# Patient Record
Sex: Female | Born: 2000 | Race: Black or African American | Hispanic: No | Marital: Single | Smoking: Never smoker
Health system: Southern US, Community
[De-identification: ages and names within clinical notes are randomized; demographics above are authoritative.]

## PROBLEM LIST (undated history)

## (undated) ENCOUNTER — Ambulatory Visit: Admission: EM | Payer: BC Managed Care – PPO | Source: Home / Self Care

## (undated) ENCOUNTER — Ambulatory Visit: Source: Home / Self Care

## (undated) DIAGNOSIS — F988 Other specified behavioral and emotional disorders with onset usually occurring in childhood and adolescence: Secondary | ICD-10-CM

## (undated) DIAGNOSIS — J45909 Unspecified asthma, uncomplicated: Secondary | ICD-10-CM

## (undated) HISTORY — DX: Unspecified asthma, uncomplicated: J45.909

---
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
## 2022-01-28 ENCOUNTER — Ambulatory Visit
Admission: EM | Admit: 2022-01-28 | Discharge: 2022-01-28 | Disposition: A | Discharge status: Home / Self Care | Payer: Medicaid Other | Attending: Physician Assistant | Admitting: Physician Assistant

## 2022-01-28 ENCOUNTER — Other Ambulatory Visit: Payer: Self-pay

## 2022-01-28 ENCOUNTER — Encounter: Payer: Self-pay | Admitting: Emergency Medicine

## 2022-01-28 DIAGNOSIS — J069 Acute upper respiratory infection, unspecified: Secondary | ICD-10-CM

## 2022-01-28 NOTE — ED Provider Notes (Signed)
[name]    CSN: [id number] Arrival date & time: 01/28/22  1047      History   Chief Complaint Chief Complaint  Patient presents with   Sore Throat    HPI Melanie Stephenson is a 21 y.o. female.   Patient here today for evaluation of cough, sore throat, headache, body aches and chills that started 4 days ago. She has not taken any otc meds. She denies any nausea, vomiting or diarrhea.   The history is provided by the patient.   Past Medical History:  Diagnosis Date   ADD (attention deficit disorder)     Patient Active Problem List   Diagnosis Date Noted   ADD (attention deficit disorder)     History reviewed. No pertinent surgical history.  OB History   No obstetric history on file.      Home Medications    Prior to Admission medications   Medication Sig Start Date End Date Taking? Authorizing Provider  HYDROcodone bit-homatropine (HYCODAN) 5-1.5 MG/5ML syrup Take 5 mLs by mouth every 6 (six) hours as needed for cough. 11/13/21   Mesner, Corene Cornea, MD  lisdexamfetamine (VYVANSE) 20 MG capsule Take 20 mg by mouth daily.    [provider]  ondansetron (ZOFRAN-ODT) 4 MG disintegrating tablet 4mg  ODT q4 hours prn nausea/vomit 11/13/21   Mesner, Corene Cornea, MD  triamcinolone cream (KENALOG) 0.1 % Apply 1 application topically 2 (two) times daily. May use on face 05/26/14   Janne Napoleon, NP    Family History Family History  Problem Relation Age of Onset   Healthy Other     Social History Social History   Tobacco Use   Smoking status: Never  Substance Use Topics   Alcohol use: Not Currently   Drug use: Not Currently     Allergies   Other   Review of Systems Review of Systems  Constitutional:  Positive for chills. Negative for fever.  HENT:  Positive for congestion and sore throat. Negative for ear pain.   Eyes:  Negative for discharge and redness.  Respiratory:  Positive for cough. Negative for shortness of breath and wheezing.    Gastrointestinal:  Negative for abdominal pain, diarrhea, nausea and vomiting.  Musculoskeletal:  Positive for myalgias.  Neurological:  Positive for headaches.    Physical Exam Triage Vital Signs ED Triage Vitals  Enc Vitals Group     BP      Pulse      Resp      Temp      Temp src      SpO2      Weight      Height      Head Circumference      Peak Flow      Pain Score      Pain Loc      Pain Edu?      Excl. in [place]?    No data found.  Updated Vital Signs BP 121/86 (BP Location: Left Arm)    Pulse 82    Temp 98.4 F (36.9 C) (Oral)    Resp 18    Ht 5\' 2"  (1.575 m)    Wt 136 lb (61.7 kg)    LMP 01/08/2022    SpO2 97%    BMI 24.87 kg/m      Physical Exam Vitals and nursing note reviewed.  Constitutional:      General: She is not in acute distress.    Appearance: Normal appearance. She is not ill-appearing.  HENT:     Head: Normocephalic and atraumatic.     Nose: Congestion present.     Mouth/Throat:     Mouth: Mucous membranes are moist.     Pharynx: No oropharyngeal exudate or posterior oropharyngeal erythema.  Eyes:     Conjunctiva/sclera: Conjunctivae normal.  Cardiovascular:     Rate and Rhythm: Normal rate and regular rhythm.     Heart sounds: Normal heart sounds. No murmur heard. Pulmonary:     Effort: Pulmonary effort is normal. No respiratory distress.     Breath sounds: Normal breath sounds. No wheezing, rhonchi or rales.  Skin:    General: Skin is warm and dry.  Neurological:     Mental Status: She is alert.  Psychiatric:        Mood and Affect: Mood normal.        Thought Content: Thought content normal.     UC Treatments / Results  Labs (all labs ordered are listed, but only abnormal results are displayed) Labs Reviewed  COVID-19, FLU A+B NAA    EKG   Radiology No results found.  Procedures Procedures (including critical care time)  Medications Ordered in UC Medications - No data to display  Initial Impression / Assessment  and Plan / UC Course  I have reviewed the triage vital signs and the nursing notes.  Pertinent labs & imaging results that were available during my care of the patient were reviewed by me and considered in my medical decision making (see chart for details).    Suspect viral etiology of symptoms. Recommend symptomatic treatment and will screen for covid and flu. Encourage follow up with any further concerns.   Final Clinical Impressions(s) / UC Diagnoses   Final diagnoses:  Acute upper respiratory infection   Discharge Instructions   None    ED Prescriptions   None    PDMP not reviewed this encounter.   Francene Finders, PA-C 01/28/22 1238

## 2022-01-28 NOTE — ED Triage Notes (Signed)
Patient c/o productive cough, sore throat, headache, body aches and chills x 4 days.  Patient denies any OTC meds.

## 2022-01-29 LAB — COVID-19, FLU A+B NAA
Influenza A, NAA: NOT DETECTED
Influenza B, NAA: NOT DETECTED
SARS-CoV-2, NAA: NOT DETECTED

## 2022-05-21 ENCOUNTER — Ambulatory Visit (HOSPITAL_COMMUNITY)
Admission: EM | Admit: 2022-05-21 | Discharge: 2022-05-21 | Disposition: A | Discharge status: Home / Self Care | Payer: Medicaid Other | Attending: Student | Admitting: Student

## 2022-05-21 ENCOUNTER — Emergency Department (HOSPITAL_COMMUNITY): Payer: Medicaid Other

## 2022-05-21 ENCOUNTER — Emergency Department (HOSPITAL_COMMUNITY)
Admission: EM | Admit: 2022-05-21 | Discharge: 2022-05-21 | Disposition: A | Discharge status: Home / Self Care | Payer: Medicaid Other | Attending: Emergency Medicine | Admitting: Emergency Medicine

## 2022-05-21 ENCOUNTER — Encounter (HOSPITAL_COMMUNITY): Payer: Self-pay

## 2022-05-21 ENCOUNTER — Other Ambulatory Visit: Payer: Self-pay

## 2022-05-21 ENCOUNTER — Encounter (HOSPITAL_COMMUNITY): Payer: Self-pay | Admitting: Emergency Medicine

## 2022-05-21 DIAGNOSIS — R110 Nausea: Secondary | ICD-10-CM | POA: Insufficient documentation

## 2022-05-21 DIAGNOSIS — R1011 Right upper quadrant pain: Secondary | ICD-10-CM

## 2022-05-21 DIAGNOSIS — R1013 Epigastric pain: Secondary | ICD-10-CM | POA: Insufficient documentation

## 2022-05-21 LAB — CBC WITH DIFFERENTIAL/PLATELET
Abs Immature Granulocytes: 0.03 10*3/uL (ref 0.00–0.07)
Basophils Absolute: 0 10*3/uL (ref 0.0–0.1)
Basophils Relative: 1 %
Eosinophils Absolute: 0.1 10*3/uL (ref 0.0–0.5)
Eosinophils Relative: 2 %
HCT: 35.2 % — ABNORMAL LOW (ref 36.0–46.0)
Hemoglobin: 11.1 g/dL — ABNORMAL LOW (ref 12.0–15.0)
Immature Granulocytes: 1 %
Lymphocytes Relative: 39 %
Lymphs Abs: 2.4 10*3/uL (ref 0.7–4.0)
MCH: 29.2 pg (ref 26.0–34.0)
MCHC: 31.5 g/dL (ref 30.0–36.0)
MCV: 92.6 fL (ref 80.0–100.0)
Monocytes Absolute: 0.6 10*3/uL (ref 0.1–1.0)
Monocytes Relative: 10 %
Neutro Abs: 3 10*3/uL (ref 1.7–7.7)
Neutrophils Relative %: 47 %
Platelets: 302 10*3/uL (ref 150–400)
RBC: 3.8 MIL/uL — ABNORMAL LOW (ref 3.87–5.11)
RDW: 13.6 % (ref 11.5–15.5)
WBC: 6.2 10*3/uL (ref 4.0–10.5)
nRBC: 0 % (ref 0.0–0.2)

## 2022-05-21 LAB — URINALYSIS, ROUTINE W REFLEX MICROSCOPIC
Bilirubin Urine: NEGATIVE
Glucose, UA: NEGATIVE mg/dL
Hgb urine dipstick: NEGATIVE
Ketones, ur: NEGATIVE mg/dL
Nitrite: NEGATIVE
Protein, ur: NEGATIVE mg/dL
Specific Gravity, Urine: 1.021 (ref 1.005–1.030)
pH: 6 (ref 5.0–8.0)

## 2022-05-21 LAB — COMPREHENSIVE METABOLIC PANEL
ALT: 39 U/L (ref 0–44)
AST: 34 U/L (ref 15–41)
Albumin: 4.1 g/dL (ref 3.5–5.0)
Alkaline Phosphatase: 42 U/L (ref 38–126)
Anion gap: 6 (ref 5–15)
BUN: 12 mg/dL (ref 6–20)
CO2: 28 mmol/L (ref 22–32)
Calcium: 9.6 mg/dL (ref 8.9–10.3)
Chloride: 106 mmol/L (ref 98–111)
Creatinine, Ser: 0.69 mg/dL (ref 0.44–1.00)
GFR, Estimated: >60 mL/min (ref >60)
Glucose, Bld: 98 mg/dL (ref 70–99)
Potassium: 3.9 mmol/L (ref 3.5–5.1)
Sodium: 140 mmol/L (ref 135–145)
Total Bilirubin: 0.5 mg/dL (ref 0.3–1.2)
Total Protein: 7.2 g/dL (ref 6.5–8.1)

## 2022-05-21 LAB — POC URINE PREG, ED: Preg Test, Ur: NEGATIVE

## 2022-05-21 LAB — LIPASE, BLOOD: Lipase: 28 U/L (ref 11–51)

## 2022-05-21 LAB — I-STAT BETA HCG BLOOD, ED (MC, WL, AP ONLY): I-stat hCG, quantitative: <5 m[IU]/mL (ref <5)

## 2022-05-21 MED ORDER — SUCRALFATE 1 G PO TABS: 1.0000 g | ORAL_TABLET | Freq: Three times a day (TID) | ORAL | 1 refills | Status: DC

## 2022-05-21 MED ORDER — OMEPRAZOLE 20 MG PO CPDR
20.0000 mg | DELAYED_RELEASE_CAPSULE | Freq: Every day | ORAL | 1 refills | Status: DC
Start: 2022-05-21 — End: 2022-07-07

## 2022-05-21 NOTE — Discharge Instructions (Addendum)
Get help right away if: You have sudden, sharp, or persistent pain in your abdomen. You have bloody or dark black, tarry stools. You vomit blood or material that looks like coffee grounds. You become light-headed or you feel faint. You become weak. You become sweaty or clammy. 

## 2022-05-21 NOTE — ED Provider Notes (Signed)
[place]    CSN: [id number] Arrival date & time: 05/21/22  1717      History   Chief Complaint Chief Complaint  Patient presents with   Abdominal Pain    HPI Melanie Stephenson is a 21 y.o. female presenting with right upper quadrant abdominal pain for 3 days.  History noncontributory, no prior history of abdominal procedures or surgeries.  Pain feels as if a hole is being stabbed in her abdomen.  The pain is worse after eating and drinking.  There is some nausea but no vomiting.  She does also endorse intermittent reflux, but this is not the predominant symptom.  She denies dysuria, hematuria, frequency, urgency.  Bowel movements are regular and she is still passing gas.  Has not attempted medications for the symptoms.  Last menstrual period was 05/08/2022, states she is not pregnant or breast-feeding.  HPI  Past Medical History:  Diagnosis Date   ADD (attention deficit disorder)     Patient Active Problem List   Diagnosis Date Noted   ADD (attention deficit disorder)     History reviewed. No pertinent surgical history.  OB History   No obstetric history on file.      Home Medications    Prior to Admission medications   Not on File    Family History Family History  Problem Relation Age of Onset   Healthy Other     Social History Social History   Tobacco Use   Smoking status: Never  Substance Use Topics   Alcohol use: Not Currently   Drug use: Not Currently     Allergies   Other   Review of Systems Review of Systems  Gastrointestinal:  Positive for abdominal pain and nausea.  All other systems reviewed and are negative.   Physical Exam Triage Vital Signs ED Triage Vitals  Enc Vitals Group     BP 05/21/22 1738 114/76     Pulse Rate 05/21/22 1738 77     Resp 05/21/22 1738 16     Temp 05/21/22 1738 98.3 F (36.8 C)     Temp Source 05/21/22 1738 Oral     SpO2 05/21/22 1738 98 %     Weight --      Height --      Head Circumference --       Peak Flow --      Pain Score 05/21/22 1742 7     Pain Loc --      Pain Edu? --      Excl. in [place]? --    No data found.  Updated Vital Signs BP 114/76 (BP Location: Right Arm)   Pulse 77   Temp 98.3 F (36.8 C) (Oral)   Resp 16   LMP 05/08/2022 (Exact Date)   SpO2 98%   Visual Acuity Right Eye Distance:   Left Eye Distance:   Bilateral Distance:    Right Eye Near:   Left Eye Near:    Bilateral Near:     Physical Exam Vitals reviewed.  Constitutional:      General: She is not in acute distress.    Appearance: Normal appearance. She is not ill-appearing.  HENT:     Head: Normocephalic and atraumatic.     Mouth/Throat:     Mouth: Mucous membranes are moist.     Comments: Moist mucous membranes Eyes:     Extraocular Movements: Extraocular movements intact.     Pupils: Pupils are equal, round, and reactive to light.  Cardiovascular:     Rate and Rhythm: Normal rate and regular rhythm.     Heart sounds: Normal heart sounds.  Pulmonary:     Effort: Pulmonary effort is normal.     Breath sounds: Normal breath sounds. No wheezing, rhonchi or rales.  Abdominal:     General: Bowel sounds are normal. There is no distension.     Palpations: Abdomen is soft. There is no mass.     Tenderness: There is abdominal tenderness in the right upper quadrant. There is no right CVA tenderness, left CVA tenderness, guarding or rebound. Positive signs include Murphy's sign.     Comments: Significant RUQ pain to palpation. Pt uncomfortable throughout exam. No rebound.  Skin:    General: Skin is warm.     Capillary Refill: Capillary refill takes less than 2 seconds.     Comments: Good skin turgor  Neurological:     General: No focal deficit present.     Mental Status: She is alert and oriented to person, place, and time.  Psychiatric:        Mood and Affect: Mood normal.        Behavior: Behavior normal.     UC Treatments / Results  Labs (all labs ordered are listed, but only  abnormal results are displayed) Labs Reviewed  POC URINE PREG, ED    EKG   Radiology No results found.  Procedures Procedures (including critical care time)  Medications Ordered in UC Medications - No data to display  Initial Impression / Assessment and Plan / UC Course  I have reviewed the triage vital signs and the nursing notes.  Pertinent labs & imaging results that were available during my care of the patient were reviewed by me and considered in my medical decision making (see chart for details).     This patient is a very pleasant 21 y.o. year old female presenting with RUQ pain. Afebrile, nontachy. Significantly TTP on exam. I have suspicion for cholecystitis given significant RUQ pain; pain worse after eating, associated with nausea and diarrhea. No prior history abd procedures. LMP 05/08/22, States she is not pregnant or breastfeeding. Sent to ED via POV, she is in agreement.   Final Clinical Impressions(s) / UC Diagnoses   Final diagnoses:  RUQ pain     Discharge Instructions      Sent to ED via POV, declines AVS   ED Prescriptions   None    PDMP not reviewed this encounter.   Hazel Sams, PA-C 05/21/22 1805

## 2022-05-21 NOTE — ED Provider Notes (Signed)
[facility] EMERGENCY DEPARTMENT Provider Note   CSN: [id number] Arrival date & time: 05/21/22  1827     History  Chief Complaint  Patient presents with   Abdominal Pain    Melanie Stephenson is a 21 y.o. female who was sent from urgent care for epic stricken right upper quadrant abdominal pain.  Patient was seen earlier today at [facility] urgent care for right upper quadrant abdominal pain for 3 days.  She has been having intermittent reflux and waterbrash.  Today her pain became severe 1 hour after she ate some chicken nuggets.  She has not had severe pain like this in the past.  She had some nausea without vomiting.   Abdominal Pain     Home Medications Prior to Admission medications   Medication Sig Start Date End Date Taking? Authorizing Provider  omeprazole (PRILOSEC) 20 MG capsule Take 1 capsule (20 mg total) by mouth daily. 05/21/22  Yes Inara Dike, PA-C  sucralfate (CARAFATE) 1 g tablet Take 1 tablet (1 g total) by mouth 4 (four) times daily -  with meals and at bedtime. 05/21/22  Yes Arthor Captain, PA-C      Allergies    Other and Tilactase    Review of Systems   Review of Systems  Gastrointestinal:  Positive for abdominal pain.   Physical Exam Updated Vital Signs BP (!) 121/97 (BP Location: Right Arm)   Pulse 73   Temp 98.2 F (36.8 C) (Oral)   Resp 16   Ht 5\' 2"  (1.575 m)   Wt 61.7 kg   LMP 05/08/2022 (Exact Date)   SpO2 98%   BMI 24.87 kg/m  Physical Exam Vitals and nursing note reviewed.  Constitutional:      General: She is not in acute distress.    Appearance: She is well-developed. She is not diaphoretic.  HENT:     Head: Normocephalic and atraumatic.     Right Ear: External ear normal.     Left Ear: External ear normal.     Nose: Nose normal.     Mouth/Throat:     Mouth: Mucous membranes are moist.  Eyes:     General: No scleral icterus.    Conjunctiva/sclera: Conjunctivae normal.  Cardiovascular:     Rate and Rhythm:  Normal rate and regular rhythm.     Heart sounds: Normal heart sounds. No murmur heard.   No friction rub. No gallop.  Pulmonary:     Effort: Pulmonary effort is normal. No respiratory distress.     Breath sounds: Normal breath sounds.  Abdominal:     General: Bowel sounds are normal. There is no distension.     Palpations: Abdomen is soft. There is no mass.     Tenderness: There is no abdominal tenderness. There is no guarding.  Musculoskeletal:     Cervical back: Normal range of motion.  Skin:    General: Skin is warm and dry.  Neurological:     Mental Status: She is alert and oriented to person, place, and time.  Psychiatric:        Behavior: Behavior normal.    ED Results / Procedures / Treatments   Labs (all labs ordered are listed, but only abnormal results are displayed) Labs Reviewed  CBC WITH DIFFERENTIAL/PLATELET - Abnormal; Notable for the following components:      Result Value   RBC 3.80 (*)    Hemoglobin 11.1 (*)    HCT 35.2 (*)    All other components  within normal limits  COMPREHENSIVE METABOLIC PANEL  LIPASE, BLOOD  URINALYSIS, ROUTINE W REFLEX MICROSCOPIC  I-STAT BETA HCG BLOOD, ED ([facility], [facility], [facility] ONLY)    EKG None  Radiology US Abdomen Limited RUQ (LIVER/GB)  Result Date: 05/21/2022 CLINICAL DATA:  Right upper quadrant abdominal pain. EXAM: ULTRASOUND ABDOMEN LIMITED RIGHT UPPER QUADRANT COMPARISON:  None Available. FINDINGS: Gallbladder: No gallstones or wall thickening visualized. No sonographic Murphy sign noted by sonographer. Common bile duct: Diameter: Normal, 2 mm. Liver: No focal lesion identified. Within normal limits in parenchymal echogenicity. Portal vein is patent on color Doppler imaging with normal direction of blood flow towards the liver. Other: None. IMPRESSION: Normal right upper quadrant ultrasound. Electronically Signed   By: Jeronimo Greaves M.D.   On: 05/21/2022 19:52    Procedures Procedures    Medications Ordered in ED Medications -  No data to display  ED Course/ Medical Decision Making/ A&P Clinical Course as of 05/21/22 2254  Fri May 21, 2022  2252 Comprehensive metabolic panel [AH]  2252 POC beta hCG blood, ED [AH]  2252 Lipase, blood [AH]  2252 CBC with Differential(!) I reviewed patient's labs, no acute findings [AH]  2252 US Abdomen Limited RUQ (LIVER/GB) I personally visualized right upper quadrant abdominal ultrasound, no evidence of cholecystitis, CBD enlargement or gallbladder wall thickening suggestive of cholecystitis. [AH]    Clinical Course User Index [AH] Arthor Captain, PA-C                           Medical Decision Making 21 year old female here with epigastric abdominal pain.Differential diagnosis of epigastric pain includes: Functional or nonulcer dyspepsia, PUD, GERD, Gastritis, (NSAIDs, alcohol, stress, H. pylori, pernicious anemia), pancreatitis or pancreatic cancer, overeating indigestion (high-fat foods, coffee), drugs (aspirin, antibiotics (eg, macrolides, metronidazole), corticosteroids, digoxin, narcotics, theophylline), gastroparesis, lactose intolerance, malabsorption gastric cancer, parasitic infection, (Giardia, Strongyloides, Ascaris) cholelithiasis, choledocholithiasis, or cholangitis, ACS, pericarditis, pneumonia, abdominal hernia, pregnancy, intestinal ischemia, esophageal rupture, gastric volvulus, hepatitis.  After review of the work-up today patient does not appear to have acute cholecystitis.  She may be having biliary colic without cholelithiasis.  No evidence of elevated liver enzymes.  More likely the patient has been having ongoing issues with reflux and she could have a duodenal ulcer.  I will discharge the patient on PPI and Carafate along with dietary modification and close outpatient follow-up with gastroenterology.  On reevaluation the patient has no abdominal tenderness.  I have no concern for other acute cause such as ACS.  I considered CT imaging but given her lack of  pain, benign abdominal exam and unremarkable labs do not think this is necessary at this time.  Patient agrees with plan and appears appropriate for discharge   Amount and/or Complexity of Data Reviewed Independent Historian: friend Labs: ordered. Decision-making details documented in ED Course. Radiology: independent interpretation performed. Decision-making details documented in ED Course.           Final Clinical Impression(s) / ED Diagnoses Final diagnoses:  Epigastric abdominal pain    Rx / DC Orders ED Discharge Orders          Ordered    omeprazole (PRILOSEC) 20 MG capsule  Daily        05/21/22 2250    sucralfate (CARAFATE) 1 g tablet  3 times daily with meals & bedtime        05/21/22 2250  Arthor CaptainHarris, Yitzchak Kothari, PA-C 05/21/22 2255    Terald Sleeperrifan, Matthew J, MD 05/22/22 [phone]

## 2022-05-21 NOTE — ED Triage Notes (Signed)
Com[plains of right upper quad pain that started on Tuesday.  Reports pain is only after eating and drinking.  +nausea

## 2022-05-21 NOTE — Discharge Instructions (Addendum)
Sent to ED via POV, declines AVS  

## 2022-05-21 NOTE — ED Provider Triage Note (Signed)
Emergency Medicine Provider Triage Evaluation Note  Melanie Stephenson , a 21 y.o. female  was evaluated in triage.  Pt complains of abd pain. RUQ pain brought on by eating x 3 days with nausea.  No fever, cp, sob, cough, dysuria  Review of Systems  Positive: As above Negative: As above  Physical Exam  BP (!) 121/97 (BP Location: Right Arm)   Pulse 73   Temp 98.2 F (36.8 C) (Oral)   Resp 16   Ht 5\' 2"  (1.575 m)   Wt 61.7 kg   LMP 05/08/2022 (Exact Date)   SpO2 98%   BMI 24.87 kg/m  Gen:   Awake, no distress   Resp:  Normal effort  MSK:   Moves extremities without difficulty  Other:    Medical Decision Making  Medically screening exam initiated at 6:52 PM.  Appropriate orders placed.  Melanie Stephenson was informed that the remainder of the evaluation will be completed by another provider, this initial triage assessment does not replace that evaluation, and the importance of remaining in the ED until their evaluation is complete.  R/o cholecystitis   05/10/2022, PA-C 05/21/22 1900

## 2022-05-21 NOTE — ED Triage Notes (Addendum)
Patient c/o RUQ ABD pain that started 3 days ago.   Patient endorses nausea at times. Patient endorses diarrhea at times " once a day".   Patient endorses acid reflux at times. Patient endorses pain worsens with eating or drinking.   Patient denies dysuria. Patient denies constipation.   Patient hasn't taken any mediations for symptoms.   Patients LMP was 05/08/2022.

## 2022-05-24 ENCOUNTER — Encounter: Payer: Self-pay | Admitting: Gastroenterology

## 2022-05-25 ENCOUNTER — Ambulatory Visit
Admission: EM | Admit: 2022-05-25 | Discharge: 2022-05-25 | Disposition: A | Discharge status: Home / Self Care | Payer: Medicaid Other | Attending: Student | Admitting: Student

## 2022-05-25 DIAGNOSIS — R1013 Epigastric pain: Secondary | ICD-10-CM | POA: Diagnosis not present

## 2022-05-25 MED ORDER — SUCRALFATE 1 G PO TABS
1.0000 g | ORAL_TABLET | Freq: Three times a day (TID) | ORAL | 1 refills | Status: DC
Start: 2022-05-25 — End: 2022-07-21

## 2022-05-25 MED ORDER — ONDANSETRON 4 MG PO TBDP: 4.0000 mg | ORAL_TABLET | Freq: Three times a day (TID) | ORAL | 0 refills | Status: DC | PRN

## 2022-05-25 NOTE — ED Triage Notes (Signed)
Pt presents with chronic abdominal pain from newly diagnosed abdominal ulcers , pt is just needing a note for work.

## 2022-05-25 NOTE — ED Provider Notes (Signed)
[name]    CSN: [id number] Arrival date & time: 05/25/22  1618      History   Chief Complaint Chief Complaint  Patient presents with   Abdominal Pain    HPI Melanie Stephenson is a 21 y.o. female presenting with epigastric pain and bloating. She was evaluated in the ED for this on 05/21/22 and diagnosed with  suspected PUD and managed with carafate and PPI with some improvement. States shes here today for work note. There is occasionally nausea without vomiting after eating. Diarrhea. Never any melena, hematochezia, or hematemesis.   HPI  Past Medical History:  Diagnosis Date   ADD (attention deficit disorder)     Patient Active Problem List   Diagnosis Date Noted   ADD (attention deficit disorder)     History reviewed. No pertinent surgical history.  OB History   No obstetric history on file.      Home Medications    Prior to Admission medications   Medication Sig Start Date End Date Taking? Authorizing Provider  ondansetron (ZOFRAN-ODT) 4 MG disintegrating tablet Take 1 tablet (4 mg total) by mouth every 8 (eight) hours as needed for nausea or vomiting. 05/25/22  Yes Rhys Martini, PA-C  omeprazole (PRILOSEC) 20 MG capsule Take 1 capsule (20 mg total) by mouth daily. 05/21/22   Arthor Captain, PA-C  sucralfate (CARAFATE) 1 g tablet Take 1 tablet (1 g total) by mouth 4 (four) times daily -  with meals and at bedtime. 05/25/22   Rhys Martini, PA-C    Family History Family History  Problem Relation Age of Onset   Healthy Other     Social History Social History   Tobacco Use   Smoking status: Never  Substance Use Topics   Alcohol use: Not Currently   Drug use: Not Currently     Allergies   Other and Tilactase   Review of Systems Review of Systems  Gastrointestinal:  Positive for abdominal pain.  All other systems reviewed and are negative.   Physical Exam Triage Vital Signs ED Triage Vitals  Enc Vitals Group     BP 05/25/22 1732 117/77      Pulse Rate 05/25/22 1732 81     Resp 05/25/22 1732 17     Temp 05/25/22 1732 98.2 F (36.8 C)     Temp Source 05/25/22 1732 Oral     SpO2 05/25/22 1732 97 %     Weight --      Height --      Head Circumference --      Peak Flow --      Pain Score 05/25/22 1735 6     Pain Loc --      Pain Edu? --      Excl. in [place]? --    No data found.  Updated Vital Signs BP 117/77 (BP Location: Left Arm)   Pulse 81   Temp 98.2 F (36.8 C) (Oral)   Resp 17   LMP 05/08/2022 (Exact Date)   SpO2 97%   Visual Acuity Right Eye Distance:   Left Eye Distance:   Bilateral Distance:    Right Eye Near:   Left Eye Near:    Bilateral Near:     Physical Exam Vitals reviewed.  Constitutional:      General: She is not in acute distress.    Appearance: Normal appearance. She is not ill-appearing.  HENT:     Head: Normocephalic and atraumatic.     Mouth/Throat:  Mouth: Mucous membranes are moist.     Comments: Moist mucous membranes Eyes:     Extraocular Movements: Extraocular movements intact.     Pupils: Pupils are equal, round, and reactive to light.  Cardiovascular:     Rate and Rhythm: Normal rate and regular rhythm.     Heart sounds: Normal heart sounds.  Pulmonary:     Effort: Pulmonary effort is normal.     Breath sounds: Normal breath sounds. No wheezing, rhonchi or rales.  Abdominal:     General: Bowel sounds are normal. There is no distension.     Palpations: Abdomen is soft. There is no mass.     Tenderness: There is abdominal tenderness in the epigastric area. There is no right CVA tenderness, left CVA tenderness, guarding or rebound.     Comments: No guarding, rebound. Comfortable throughout exam.l  Skin:    General: Skin is warm.     Capillary Refill: Capillary refill takes less than 2 seconds.     Comments: Good skin turgor  Neurological:     General: No focal deficit present.     Mental Status: She is alert and oriented to person, place, and time.  Psychiatric:         Mood and Affect: Mood normal.        Behavior: Behavior normal.     UC Treatments / Results  Labs (all labs ordered are listed, but only abnormal results are displayed) Labs Reviewed - No data to display  EKG   Radiology No results found.  Procedures Procedures (including critical care time)  Medications Ordered in UC Medications - No data to display  Initial Impression / Assessment and Plan / UC Course  I have reviewed the triage vital signs and the nursing notes.  Pertinent labs & imaging results that were available during my care of the patient were reviewed by me and considered in my medical decision making (see chart for details).     This patient is a very pleasant 20 y.o. year old female presenting with suspected PUD. Afebrile, nontachy. She was fully evaluated in the ED for this 05/21/22 and prescribed sucralfate and PPI with improvement, but she requires a work note today. She establishes care with GI in 1 month. Work note provided. Zofran sent for symptomatic relief. States she is not pregnant or breastfeeding. .   Final Clinical Impressions(s) / UC Diagnoses   Final diagnoses:  Epigastric pain     Discharge Instructions      -Continue the carafate 4x daily  -Take the Zofran (ondansetron) up to 3 times daily for nausea and vomiting. -Follow-up with GI as scheduled -Head to the ED if new symptoms - severe abdominal pain, vomiting black or red, your poop becomes black or red   ED Prescriptions     Medication Sig Dispense Auth. Provider   sucralfate (CARAFATE) 1 g tablet Take 1 tablet (1 g total) by mouth 4 (four) times daily -  with meals and at bedtime. 30 tablet Rhys Martini, PA-C   ondansetron (ZOFRAN-ODT) 4 MG disintegrating tablet Take 1 tablet (4 mg total) by mouth every 8 (eight) hours as needed for nausea or vomiting. 21 tablet Rhys Martini, PA-C      PDMP not reviewed this encounter.   Rhys Martini, PA-C 05/25/22 [phone]

## 2022-05-25 NOTE — Discharge Instructions (Addendum)
-  Continue the carafate 4x daily  -Take the Zofran (ondansetron) up to 3 times daily for nausea and vomiting. -Follow-up with GI as scheduled -Head to the ED if new symptoms - severe abdominal pain, vomiting black or red, your poop becomes black or red

## 2022-06-15 ENCOUNTER — Ambulatory Visit
Admission: EM | Admit: 2022-06-15 | Discharge: 2022-06-15 | Disposition: A | Discharge status: Home / Self Care | Payer: Medicaid Other | Attending: Internal Medicine | Admitting: Internal Medicine

## 2022-06-15 DIAGNOSIS — R1013 Epigastric pain: Secondary | ICD-10-CM | POA: Insufficient documentation

## 2022-06-15 DIAGNOSIS — N939 Abnormal uterine and vaginal bleeding, unspecified: Secondary | ICD-10-CM | POA: Insufficient documentation

## 2022-06-15 LAB — POCT URINALYSIS DIP (MANUAL ENTRY)
Bilirubin, UA: NEGATIVE
Blood, UA: NEGATIVE
Glucose, UA: NEGATIVE mg/dL
Ketones, POC UA: NEGATIVE mg/dL
Leukocytes, UA: NEGATIVE
Nitrite, UA: NEGATIVE
Protein Ur, POC: NEGATIVE mg/dL
Spec Grav, UA: >=1.03 — AB (ref 1.010–1.025)
Urobilinogen, UA: 0.2 E.U./dL
pH, UA: 6 (ref 5.0–8.0)

## 2022-06-15 MED ORDER — FAMOTIDINE 20 MG PO TABS
20.0000 mg | ORAL_TABLET | Freq: Every day | ORAL | 0 refills | Status: DC
Start: 2022-06-15 — End: 2023-02-11

## 2022-06-15 NOTE — ED Triage Notes (Signed)
Pt states is currently on medication for her abdominal ulcer and states still having pain at times after the meds wear off. States has an GI appt 7/18.   Pt c/o irregular menstrual cycle, states has been spotting all week.

## 2022-06-16 LAB — CERVICOVAGINAL ANCILLARY ONLY
Bacterial Vaginitis (gardnerella): NEGATIVE
Candida Glabrata: NEGATIVE
Candida Vaginitis: NEGATIVE
Comment: NEGATIVE
Comment: NEGATIVE
Comment: NEGATIVE

## 2022-07-06 ENCOUNTER — Encounter: Payer: Self-pay | Admitting: Gastroenterology

## 2022-07-06 ENCOUNTER — Ambulatory Visit (INDEPENDENT_AMBULATORY_CARE_PROVIDER_SITE_OTHER): Payer: Medicaid Other | Admitting: Gastroenterology

## 2022-07-06 ENCOUNTER — Other Ambulatory Visit (INDEPENDENT_AMBULATORY_CARE_PROVIDER_SITE_OTHER): Payer: Medicaid Other

## 2022-07-06 VITALS — BP 108/70 | HR 66 | Ht 62.0 in | Wt 143.0 lb

## 2022-07-06 DIAGNOSIS — R1013 Epigastric pain: Secondary | ICD-10-CM

## 2022-07-06 DIAGNOSIS — D649 Anemia, unspecified: Secondary | ICD-10-CM | POA: Diagnosis not present

## 2022-07-06 DIAGNOSIS — R1110 Vomiting, unspecified: Secondary | ICD-10-CM | POA: Diagnosis not present

## 2022-07-06 DIAGNOSIS — R110 Nausea: Secondary | ICD-10-CM

## 2022-07-06 DIAGNOSIS — R140 Abdominal distension (gaseous): Secondary | ICD-10-CM

## 2022-07-06 LAB — CBC
HCT: 37.7 % (ref 36.0–49.0)
Hemoglobin: 12.3 g/dL (ref 12.0–16.0)
MCHC: 32.6 g/dL (ref 31.0–37.0)
MCV: 89.9 fl (ref 78.0–98.0)
Platelets: 320 10*3/uL (ref 150.0–575.0)
RBC: 4.19 Mil/uL (ref 3.80–5.70)
RDW: 14.7 % (ref 11.4–15.5)
WBC: 5.3 10*3/uL (ref 4.5–13.5)

## 2022-07-06 NOTE — Progress Notes (Signed)
Chief Complaint:    Epigastric pain, bloating   HPI:     Patient is a 21 y.o. female presenting to the Gastroenterology Clinic for evaluation of epigastric pain and abdominal bloating.  Sxs started a few months ago, and were worse in June.    Reflux symptoms. Occasional post prandial regurgitation and belching. No dysphagia.   Bloating and epigastric pain and RUQ pain which tends to be postprandial, approximately 20-30 minutes after PO. Sxs are daily, and can be present throughout the day, but worse post prandial. No clear inciting foods.  Occasional nausea but no emesis, change in bowel habits, hematochezia, melena.  She has been seen in the ER/Urgent Care several times for the same symptoms with evaluation as follows: - 05/21/2022: Urgent Care evaluation.  Sent to ER - 05/21/2022: ER evaluation.  H/H 11.1/35.2 with MCV/RDW 20.6/13.6.  Otherwise normal WBC, PLT, CMP, lipase, hCG, UA.  RUQ [place] normal.  Exam improved and discharged home with suspected PUD and treated with Carafate and PPI.  Did notice some improvement with PPI and Carafate, but sxs recur with any missed dose.   - 06/15/2022: Urgent Care evaluation for abdominal pain.  Normal UA.  Added Pepcid 20 mg qhs (only took a single dose) and recommended to continue PPI and Carafate along with bland diet.  Today, reports she still has intermittent epigastric pain.  Has not been able to pinpoint any certain exacerbating foods.  No prior EGD or colonoscopy.  No known family history of CRC, GI malignancy, liver disease, pancreatic disease, or IBD.   Comparison labs from 10/2021 with normal CBC (H/H 13/39.9)    Review of systems:     No chest pain, no SOB, no fevers, no urinary sx   Past Medical History:  Diagnosis Date   ADD (attention deficit disorder)    Mild asthma     Patient's surgical history, family medical history, social history, medications and allergies were all reviewed in Epic    Current Outpatient Medications   Medication Sig Dispense Refill   famotidine (PEPCID) 20 MG tablet Take 1 tablet (20 mg total) by mouth at bedtime. 30 tablet 0   omeprazole (PRILOSEC) 20 MG capsule Take 1 capsule (20 mg total) by mouth daily. 30 capsule 1   ondansetron (ZOFRAN-ODT) 4 MG disintegrating tablet Take 1 tablet (4 mg total) by mouth every 8 (eight) hours as needed for nausea or vomiting. 21 tablet 0   sucralfate (CARAFATE) 1 g tablet Take 1 tablet (1 g total) by mouth 4 (four) times daily -  with meals and at bedtime. 30 tablet 1   No current facility-administered medications for this visit.    Physical Exam:     BP 108/70   Pulse 66   Ht 5\' 2"  (1.575 m)   Wt 143 lb (64.9 kg)   LMP 06/08/2022 (Exact Date)   BMI 26.16 kg/m   GENERAL:  Pleasant female in NAD PSYCH: : Cooperative, normal affect EENT:  conjunctiva pink, mucous membranes moist, neck supple without masses CARDIAC:  RRR, no murmur heard, no peripheral edema PULM: Normal respiratory effort, lungs CTA bilaterally, no wheezing ABDOMEN:  Moderate MEG TTP, mild TTP in RUQ/LUQ. No rebound or peritoneal signs. Nondistended, soft. No obvious masses, no hepatomegaly,  normal bowel sounds SKIN:  turgor, no lesions seen Musculoskeletal:  Normal muscle tone, normal strength NEURO: Alert and oriented x 3, no focal neurologic deficits   IMPRESSION and PLAN:    1) Epigastric pain 2) Upper abdominal pain  3) Abdominal bloating 4) Nausea without emesis 5) Regurgitation - Expedited EGD to evaluate for mucosal/luminal pathology with biopsies - Continue PPI and Carafate for the time being - If evaluation negative and ongoing symptoms, plan for HIDA scan  6) Normocytic anemia - Repeat CBC today. If still anemic, check iron panel, B12, folate   Provided with work note today.  The indications, risks, and benefits of EGD were explained to the patient in detail. Risks include but are not limited to bleeding, perforation, adverse reaction to medications,  and cardiopulmonary compromise. Sequelae include but are not limited to the possibility of surgery, hospitalization, and mortality. The patient verbalized understanding and wished to proceed. All questions answered, referred to scheduler. Further recommendations pending results of the exam.              Shellia Cleverly ,DO, FACG 07/06/2022, 2:13 PM

## 2022-07-06 NOTE — Patient Instructions (Addendum)
If you are age 19 or younger, your body mass index should be between 19-25. Your Body mass index is 26.16 kg/m. If this is out of the aformentioned range listed, please consider follow up with your Primary Care Provider.   ________________________________________________________  The [facility] providers would like to encourage you to use [facility] to communicate with providers for non-urgent requests or questions.  Due to [place] hold times on the telephone, sending your provider a message by [facility] may be a faster and more efficient way to get a response.  Please allow 48 business hours for a response.  Please remember that this is for non-urgent requests.  _______________________________________________________  Due to recent changes in healthcare laws, you may see the results of your imaging and laboratory studies on MyChart before your provider has had a chance to review them.  We understand that in some cases there may be results that are confusing or concerning to you. Not all laboratory results come back in the same time frame and the provider may be waiting for multiple results in order to interpret others.  Please give [place] 48 hours in order for your provider to thoroughly review all the results before contacting the office for clarification of your results.   Your provider has requested that you go to the basement level for lab work before leaving today. Press "B" on the elevator. The lab is located at the first door on the left as you exit the elevator.   You have been scheduled for an endoscopy. Please follow written instructions given to you at your visit today. If you use inhalers (even only as needed), please bring them with you on the day of your procedure.    Thank you for choosing me and [facility].  Vito Cirigliano, D.O.

## 2022-07-07 ENCOUNTER — Telehealth: Payer: Self-pay

## 2022-07-07 ENCOUNTER — Ambulatory Visit (AMBULATORY_SURGERY_CENTER): Payer: BC Managed Care – PPO | Admitting: Gastroenterology

## 2022-07-07 ENCOUNTER — Encounter: Payer: Self-pay | Admitting: Gastroenterology

## 2022-07-07 VITALS — BP 111/81 | HR 78 | Temp 97.3°F | Resp 16 | Ht 62.0 in | Wt 143.0 lb

## 2022-07-07 DIAGNOSIS — R110 Nausea: Secondary | ICD-10-CM

## 2022-07-07 DIAGNOSIS — R140 Abdominal distension (gaseous): Secondary | ICD-10-CM | POA: Diagnosis not present

## 2022-07-07 DIAGNOSIS — R1110 Vomiting, unspecified: Secondary | ICD-10-CM | POA: Diagnosis not present

## 2022-07-07 DIAGNOSIS — R1013 Epigastric pain: Secondary | ICD-10-CM

## 2022-07-07 DIAGNOSIS — K2970 Gastritis, unspecified, without bleeding: Secondary | ICD-10-CM | POA: Diagnosis not present

## 2022-07-07 DIAGNOSIS — K2990 Gastroduodenitis, unspecified, without bleeding: Secondary | ICD-10-CM

## 2022-07-07 DIAGNOSIS — K314 Gastric diverticulum: Secondary | ICD-10-CM | POA: Diagnosis not present

## 2022-07-07 MED ORDER — SODIUM CHLORIDE 0.9 % IV SOLN: 500.0000 mL | Freq: Once | INTRAVENOUS | Status: DC

## 2022-07-07 MED ORDER — OMEPRAZOLE 40 MG PO CPDR: 40.0000 mg | DELAYED_RELEASE_CAPSULE | Freq: Two times a day (BID) | ORAL | 3 refills | Status: DC

## 2022-07-07 NOTE — Progress Notes (Signed)
To pacu, VSS. Report to [email or web address] 

## 2022-07-07 NOTE — Telephone Encounter (Signed)
Patient made aware.

## 2022-07-07 NOTE — Op Note (Signed)
[facility] Patient Name: Melanie Stephenson Procedure Date: 07/07/2022 8:27 AM MRN: [id number] Endoscopist: Doristine Locks , MD Age: 21 Referring MD:  Date of Birth: 26-Nov-2001 Gender: Female Account #: [id number] Procedure:                Upper GI endoscopy Indications:              Epigastric abdominal pain, Abdominal bloating,                            Nausea, Regurgitation Medicines:                Monitored Anesthesia Care Procedure:                Pre-Anesthesia Assessment:                           - Prior to the procedure, a History and Physical                            was performed, and patient medications and                            allergies were reviewed. The patient's tolerance of                            previous anesthesia was also reviewed. The risks                            and benefits of the procedure and the sedation                            options and risks were discussed with the patient.                            All questions were answered, and informed consent                            was obtained. Prior Anticoagulants: The patient has                            taken no previous anticoagulant or antiplatelet                            agents. ASA Grade Assessment: II - A patient with                            mild systemic disease. After reviewing the risks                            and benefits, the patient was deemed in                            satisfactory condition to undergo the procedure.  After obtaining informed consent, the endoscope was                            passed under direct vision. Throughout the                            procedure, the patient's blood pressure, pulse, and                            oxygen saturations were monitored continuously. The                            Endoscope was introduced through the mouth, and                            advanced to the second part of duodenum.  The upper                            GI endoscopy was accomplished without difficulty.                            The patient tolerated the procedure well. Scope In: Scope Out: Findings:                 The examined esophagus was normal.                           A 5 mm non-bleeding diverticulum was found in the                            prepyloric region of the stomach. There was mild                            erythema and a single pinpoint erosion in the                            diverticulum base. Biopsies were taken with a cold                            forceps for histology. Estimated blood loss was                            minimal.                           Localized minimal inflammation characterized by                            erythema was found in the prepyloric region of the                            stomach. Biopsies were taken with a cold forceps  for histology. Estimated blood loss was minimal.                           The gastric fundus and gastric body were normal.                            Biopsies were taken with a cold forceps for                            Helicobacter pylori testing. Estimated blood loss                            was minimal.                           The examined duodenum was normal. Complications:            No immediate complications. Estimated Blood Loss:     Estimated blood loss was minimal. Impression:               - Normal esophagus.                           - Gastric diverticulum. Biopsied.                           - Gastritis. Biopsied.                           - Normal gastric fundus and gastric body. Biopsied.                           - Normal examined duodenum. Recommendation:           - Patient has a contact number available for                            emergencies. The signs and symptoms of potential                            delayed complications were discussed with the                             patient. Return to normal activities tomorrow.                            Written discharge instructions were provided to the                            patient.                           - Resume previous diet.                           - Increase Prilosec (omeprazole) to 40 mg PO BID  for 4 weeks, then reduce back to 20 mg BID and                            continue to titrate to lowest effective dose of                            discontinue if symptoms resolve.                           - Await pathology results.                           - Return to GI clinic in 2-3 months. Doristine Locks, MD 07/07/2022 8:51:24 AM

## 2022-07-07 NOTE — Progress Notes (Signed)
Pt's states no medical or surgical changes since previsit or office visit. 

## 2022-07-07 NOTE — Progress Notes (Signed)
Called to room to assist during endoscopic procedure.  Patient ID and intended procedure confirmed with present staff. Received instructions for my participation in the procedure from the performing physician.  

## 2022-07-07 NOTE — Progress Notes (Signed)
GASTROENTEROLOGY PROCEDURE H&P NOTE   Primary Care Physician: Pcp, No    Reason for Procedure:   Epigastric pain, bloating, nausea, regurgitation  Plan:    EGD w/ biopsy  Patient is appropriate for endoscopic procedure(s) in the ambulatory ([facility]) setting.  The nature of the procedure, as well as the risks, benefits, and alternatives were carefully and thoroughly reviewed with the patient. Ample time for discussion and questions allowed. The patient understood, was satisfied, and agreed to proceed.     HPI: Melanie Stephenson is a 21 y.o. female who presents for EGD for evaluation of epigastric pain, bloating, regurgitation, belching, nausea without emesis, normocytic anemia.  Patient was most recently seen in the Gastroenterology Clinic on 07/06/2022 by me.  No interval change in medical history since that appointment. Please refer to that note for full details regarding GI history and clinical presentation.   Past Medical History:  Diagnosis Date   ADD (attention deficit disorder)    Mild asthma     No past surgical history on file.  Prior to Admission medications   Medication Sig Start Date End Date Taking? Authorizing Provider  omeprazole (PRILOSEC) 20 MG capsule Take 1 capsule (20 mg total) by mouth daily. 05/21/22  Yes Harris, Abigail, PA-C  sucralfate (CARAFATE) 1 g tablet Take 1 tablet (1 g total) by mouth 4 (four) times daily -  with meals and at bedtime. 05/25/22  Yes Rhys Martini, PA-C  cetirizine (ZYRTEC) 10 MG tablet Take by mouth.    [provider]  famotidine (PEPCID) 20 MG tablet Take 1 tablet (20 mg total) by mouth at bedtime. 06/15/22   [name], FNP  ondansetron (ZOFRAN-ODT) 4 MG disintegrating tablet Take 1 tablet (4 mg total) by mouth every 8 (eight) hours as needed for nausea or vomiting. 05/25/22   Rhys Martini, PA-C    Current Outpatient Medications  Medication Sig Dispense Refill   omeprazole (PRILOSEC) 20 MG capsule Take 1 capsule (20 mg total)  by mouth daily. 30 capsule 1   sucralfate (CARAFATE) 1 g tablet Take 1 tablet (1 g total) by mouth 4 (four) times daily -  with meals and at bedtime. 30 tablet 1   cetirizine (ZYRTEC) 10 MG tablet Take by mouth.     famotidine (PEPCID) 20 MG tablet Take 1 tablet (20 mg total) by mouth at bedtime. 30 tablet 0   ondansetron (ZOFRAN-ODT) 4 MG disintegrating tablet Take 1 tablet (4 mg total) by mouth every 8 (eight) hours as needed for nausea or vomiting. 21 tablet 0   Current Facility-Administered Medications  Medication Dose Route Frequency Provider Last Rate Last Admin   0.9 %  sodium chloride infusion  500 mL Intravenous Once Temia Debroux V, DO        Allergies as of 07/07/2022 - Review Complete 07/07/2022  Allergen Reaction Noted   Other  11/12/2021   Tilactase Rash 12/05/2014    Family History  Problem Relation Age of Onset   Healthy Other     Social History   Socioeconomic History   Marital status: Single    Spouse name: Not on file   Number of children: Not on file   Years of education: Not on file   Highest education level: Not on file  Occupational History   Not on file  Tobacco Use   Smoking status: Never   Smokeless tobacco: Not on file  Substance and Sexual Activity   Alcohol use: Not Currently   Drug use:  Not Currently   Sexual activity: Not on file  Other Topics Concern   Not on file  Social History Narrative   Not on file   Social Determinants of Health   Financial Resource Strain: Not on file  Food Insecurity: Not on file  Transportation Needs: Not on file  Physical Activity: Not on file  Stress: Not on file  Social Connections: Not on file  Intimate Partner Violence: Not on file    Physical Exam: Vital signs in last 24 hours: @BP  104/61   Pulse 75   Temp (!) 97.3 F (36.3 C)   Ht 5\' 2"  (1.575 m)   Wt 143 lb (64.9 kg)   LMP 06/08/2022 (Exact Date)   SpO2 98%   BMI 26.16 kg/m  GEN: NAD EYE: Sclerae anicteric ENT: MMM CV:  Non-tachycardic Pulm: CTA b/l GI: Soft, NT/ND NEURO:  Alert & Oriented x 3   , DO [facility]   07/07/2022 8:24 AM

## 2022-07-07 NOTE — Patient Instructions (Signed)
Please read handouts provided. Await pathology results. Resume previous diet. Return to GI clinic in 2-3 months. Increase Prilosec ( omeprazole ) to 40 mg twice daily for 4 weeks, then reduce back to 20 mg twice daily and continue to titrate     to lowest effective dose or discontinue if symptoms resolve.  YOU HAD AN ENDOSCOPIC PROCEDURE TODAY AT THE [facility]:   Refer to the procedure report that was given to you for any specific questions about what was found during the examination.  If the procedure report does not answer your questions, please call your gastroenterologist to clarify.  If you requested that your care partner not be given the details of your procedure findings, then the procedure report has been included in a sealed envelope for you to review at your convenience later.  YOU SHOULD EXPECT: Some feelings of bloating in the abdomen. Passage of more gas than usual.  Walking can help get rid of the air that was put into your GI tract during the procedure and reduce the bloating. If you had a lower endoscopy (such as a colonoscopy or flexible sigmoidoscopy) you may notice spotting of blood in your stool or on the toilet paper. If you underwent a bowel prep for your procedure, you may not have a normal bowel movement for a few days.  Please Note:  You might notice some irritation and congestion in your nose or some drainage.  This is from the oxygen used during your procedure.  There is no need for concern and it should clear up in a day or so.  SYMPTOMS TO REPORT IMMEDIATELY:  Following upper endoscopy (EGD)  Vomiting of blood or coffee ground material  New chest pain or pain under the shoulder blades  Painful or persistently difficult swallowing  New shortness of breath  Fever of 100F or higher  Black, tarry-looking stools  For urgent or emergent issues, a gastroenterologist can be reached at any hour by calling [phone]. Do not use MyChart messaging for  urgent concerns.    DIET:  We do recommend a small meal at first, but then you may proceed to your regular diet.  Drink plenty of fluids but you should avoid alcoholic beverages for 24 hours.  ACTIVITY:  You should plan to take it easy for the rest of today and you should NOT DRIVE or use heavy machinery until tomorrow (because of the sedation medicines used during the test).    FOLLOW UP: Our staff will call the number listed on your records the next business day following your procedure.  We will call around 7:15- 8:00 am to check on you and address any questions or concerns that you may have regarding the information given to you following your procedure. If we do not reach you, we will leave a message.  If you develop any symptoms (ie: fever, flu-like symptoms, shortness of breath, cough etc.) before then, please call [phone].  If you test positive for Covid 19 in the 2 weeks post procedure, please call and report this information to [place].    If any biopsies were taken you will be contacted by phone or by letter within the next 1-3 weeks.  Please call us at [phone] if you have not heard about the biopsies in 3 weeks.    SIGNATURES/CONFIDENTIALITY: You and/or your care partner have signed paperwork which will be entered into your electronic medical record.  These signatures attest to the fact that that the information above  on your After Visit Summary has been reviewed and is understood.  Full responsibility of the confidentiality of this discharge information lies with you and/or your care-partner.

## 2022-07-07 NOTE — Telephone Encounter (Signed)
-----   Message from [facility] V, DO sent at 07/06/2022  5:04 PM EDT ----- Good news, CBC improving back to baseline with hemoglobin 12.3.

## 2022-07-08 ENCOUNTER — Telehealth: Payer: Self-pay | Admitting: Gastroenterology

## 2022-07-08 ENCOUNTER — Telehealth: Payer: Self-pay | Admitting: *Deleted

## 2022-07-08 NOTE — Telephone Encounter (Signed)
EGD yesterday with gastric biopsies only.  Otherwise no high risk intervention, to include esophageal dilation, snare polypectomy, etc.  Can certainly see some blood from biopsy sites in the first 24 hours, but typically that would be vomiting, and not coughing up mucus with chunks of blood.  The latter would allude to a pulmonary process?  If no other symptoms and otherwise tolerating p.o. intake without issue, can monitor for the time being.  If she develops further cough, fever, chills, inability tolerate p.o., increased bleeding, melena, hematochezia, or other concerns, would recommend ER for expedited evaluation.

## 2022-07-08 NOTE — Telephone Encounter (Signed)
Called the patient back and instructed her per Dr. Barron Alvine that after having gastric bx it isn;t unusual to see some mucus that might be blood tinged but if she experiences fever, chills, vomiting of blood, hematochezia orr melena that she should proceed to the ER for further evaluation. Pt verbalized understanding.

## 2022-07-08 NOTE — Telephone Encounter (Signed)
Patient had EGD yesterday with Dr. Barron Alvine.  She said today she has been coughing up thick bloody mucus and wanted to know if that was normal.  Please call patient and advise.  Thank you.

## 2022-07-08 NOTE — Telephone Encounter (Signed)
Called the patient back to address her concerns. She had EGD yesterday and has been coughing up mucous with "chunks of blood" this morning pretty frequently. No other symptoms. Will make Dr. Barron Alvine aware.

## 2022-07-08 NOTE — Telephone Encounter (Signed)
Follow up call attempt.  Left voicemail to call with any questions or concerns.

## 2022-07-19 ENCOUNTER — Telehealth: Payer: Self-pay | Admitting: Gastroenterology

## 2022-07-19 NOTE — Telephone Encounter (Signed)
Inbound call from patient stating that she had a procedure on 7/19 and has been vomiting and been nauseas. Patient also stated she has vomited blood as well. Patient is requesting a call back to discuss. Please advise.

## 2022-07-20 ENCOUNTER — Other Ambulatory Visit: Payer: Self-pay

## 2022-07-20 ENCOUNTER — Encounter (HOSPITAL_BASED_OUTPATIENT_CLINIC_OR_DEPARTMENT_OTHER): Payer: Self-pay | Admitting: Emergency Medicine

## 2022-07-20 ENCOUNTER — Emergency Department (HOSPITAL_BASED_OUTPATIENT_CLINIC_OR_DEPARTMENT_OTHER)
Admission: EM | Admit: 2022-07-20 | Discharge: 2022-07-21 | Disposition: A | Discharge status: Home / Self Care | Payer: BC Managed Care – PPO | Attending: Emergency Medicine | Admitting: Emergency Medicine

## 2022-07-20 ENCOUNTER — Encounter: Payer: Self-pay | Admitting: Gastroenterology

## 2022-07-20 DIAGNOSIS — R112 Nausea with vomiting, unspecified: Secondary | ICD-10-CM | POA: Insufficient documentation

## 2022-07-20 DIAGNOSIS — J45909 Unspecified asthma, uncomplicated: Secondary | ICD-10-CM | POA: Diagnosis not present

## 2022-07-20 DIAGNOSIS — R1013 Epigastric pain: Secondary | ICD-10-CM | POA: Insufficient documentation

## 2022-07-20 LAB — CBC WITH DIFFERENTIAL/PLATELET
Abs Immature Granulocytes: 0.01 10*3/uL (ref 0.00–0.07)
Basophils Absolute: 0 10*3/uL (ref 0.0–0.1)
Basophils Relative: 1 %
Eosinophils Absolute: 0.1 10*3/uL (ref 0.0–0.5)
Eosinophils Relative: 1 %
HCT: 36.1 % (ref 36.0–46.0)
Hemoglobin: 12.1 g/dL (ref 12.0–15.0)
Immature Granulocytes: 0 %
Lymphocytes Relative: 44 %
Lymphs Abs: 2.1 10*3/uL (ref 0.7–4.0)
MCH: 29.5 pg (ref 26.0–34.0)
MCHC: 33.5 g/dL (ref 30.0–36.0)
MCV: 88 fL (ref 80.0–100.0)
Monocytes Absolute: 0.5 10*3/uL (ref 0.1–1.0)
Monocytes Relative: 11 %
Neutro Abs: 2.1 10*3/uL (ref 1.7–7.7)
Neutrophils Relative %: 43 %
Platelets: 313 10*3/uL (ref 150–400)
RBC: 4.1 MIL/uL (ref 3.87–5.11)
RDW: 14 % (ref 11.5–15.5)
WBC: 4.8 10*3/uL (ref 4.0–10.5)
nRBC: 0 % (ref 0.0–0.2)

## 2022-07-20 LAB — COMPREHENSIVE METABOLIC PANEL
ALT: 25 U/L (ref 0–44)
AST: 25 U/L (ref 15–41)
Albumin: 4.9 g/dL (ref 3.5–5.0)
Alkaline Phosphatase: 49 U/L (ref 38–126)
Anion gap: 10 (ref 5–15)
BUN: 17 mg/dL (ref 6–20)
CO2: 26 mmol/L (ref 22–32)
Calcium: 9.5 mg/dL (ref 8.9–10.3)
Chloride: 103 mmol/L (ref 98–111)
Creatinine, Ser: 0.8 mg/dL (ref 0.44–1.00)
GFR, Estimated: >60 mL/min (ref >60)
Glucose, Bld: 78 mg/dL (ref 70–99)
Potassium: 3.5 mmol/L (ref 3.5–5.1)
Sodium: 139 mmol/L (ref 135–145)
Total Bilirubin: 0.4 mg/dL (ref 0.3–1.2)
Total Protein: 8.1 g/dL (ref 6.5–8.1)

## 2022-07-20 LAB — LIPASE, BLOOD: Lipase: 19 U/L (ref 11–51)

## 2022-07-20 MED ORDER — METOCLOPRAMIDE HCL 5 MG/ML IJ SOLN
10.0000 mg | Freq: Once | INTRAMUSCULAR | Status: AC
  Administered 2022-07-21: 10 mg via INTRAVENOUS
  Filled 2022-07-20: qty 2

## 2022-07-20 MED ORDER — PANTOPRAZOLE SODIUM 40 MG IV SOLR
40.0000 mg | Freq: Once | INTRAVENOUS | Status: AC
  Administered 2022-07-21: 40 mg via INTRAVENOUS
  Filled 2022-07-20: qty 10

## 2022-07-20 MED ORDER — SODIUM CHLORIDE 0.9 % IV BOLUS
1000.0000 mL | Freq: Once | INTRAVENOUS | Status: AC
  Administered 2022-07-21: 1000 mL via INTRAVENOUS

## 2022-07-20 NOTE — ED Notes (Signed)
Asked pt if a urine sample can be obtained, pt states that she cannot at this time. Will try to ask again later.

## 2022-07-20 NOTE — ED Triage Notes (Signed)
Patient states she has had clear bloody tinged vomiting x 1 this morning, has been nauseous throughout the day and no appetite.  Had endo last week.

## 2022-07-20 NOTE — Telephone Encounter (Signed)
Left message for pt to call back  °

## 2022-07-20 NOTE — Telephone Encounter (Signed)
Pt states she has been vomiting up a large volume of blood since yesterday. Pt reports she unable to keep anything down including water and medications. Pt has been unable to eat anything due to frequent vomiting. Advised pt to go to urgent care or ER to be assessed today.

## 2022-07-20 NOTE — ED Notes (Signed)
Pt via pov from home with emesis since yesterday with small amounts of blood in it. Pt reports an endoscopy last week and called the office where she had the endo and was told to come to the ED. Last emesis was this morning; pt currently nauseous. Pt alert & oriented, nad noted.

## 2022-07-20 NOTE — ED Provider Notes (Signed)
[facility] Provider Note: Melanie Dell, MD, FACEP  CSN: [id number] MRN: [id number] ARRIVAL: 07/20/22 at 2230 ROOM: DB014/DB014   CHIEF COMPLAINT  Vomiting   HISTORY OF PRESENT ILLNESS  07/20/22 11:00 PM Melanie Stephenson is a 21 y.o. female who underwent an upper endoscopy on 07/07/2022 which showed gastritis.  She is here with 1 episode of blood-tinged but otherwise clear vomiting yesterday morning.  She had another episode of non-bloody vomiting this morning..  She has continued to be nauseated throughout the day with no appetite and states she has not been able to keep anything down.  She is having epigastric pain with this which she rates as a 5 out of 10.   Past Medical History:  Diagnosis Date   ADD (attention deficit disorder)    Mild asthma     History reviewed. No pertinent surgical history.  Family History  Problem Relation Age of Onset   Healthy Other     Social History   Tobacco Use   Smoking status: Never  Substance Use Topics   Alcohol use: Not Currently   Drug use: Not Currently    Prior to Admission medications   Medication Sig Start Date End Date Taking? Authorizing Provider  ondansetron (ZOFRAN) 8 MG tablet Take 1 tablet (8 mg total) by mouth every 8 (eight) hours as needed for nausea or vomiting. 07/21/22  Yes Carmin Alvidrez, MD  cetirizine (ZYRTEC) 10 MG tablet Take by mouth.    [provider]  famotidine (PEPCID) 20 MG tablet Take 1 tablet (20 mg total) by mouth at bedtime. 06/15/22   [name], FNP  omeprazole (PRILOSEC) 40 MG capsule Take 1 capsule (40 mg total) by mouth 2 (two) times daily. Prilosec ( omeprazole ) 40 mg twice daily for 4 weeks. Then reduce back to 20 mg twice daily and continue to titrate to lowest effective dose, or discontinue if symptoms resolve. 07/07/22   Cirigliano, Vito V, DO  sucralfate (CARAFATE) 1 g tablet Take 1 tablet (1 g total) by mouth 4 (four) times daily -  with meals and at bedtime. Take omeprazole  (Prilosec) at least 30 minutes before first morning dose of this medication. 07/21/22   Aashir Umholtz, MD    Allergies Other and Tilactase   REVIEW OF SYSTEMS  Negative except as noted here or in the History of Present Illness.   PHYSICAL EXAMINATION  Initial Vital Signs Blood pressure (!) 121/92, pulse 78, temperature 98.1 F (36.7 C), resp. rate 18, SpO2 100 %.  Examination General: Well-developed, well-nourished female in no acute distress; appearance consistent with age of record HENT: normocephalic; atraumatic Eyes: Normal appearing Neck: supple Heart: regular rate and rhythm Lungs: clear to auscultation bilaterally Abdomen: soft; nondistended; epigastric tenderness; bowel sounds present Extremities: No deformity; full range of motion; pulses normal Neurologic: Awake, alert and oriented; motor function intact in all extremities and symmetric; no facial droop Skin: Warm and dry Psychiatric: Normal mood and affect   RESULTS  Summary of this visit's results, reviewed and interpreted by myself:   EKG Interpretation  Date/Time:    Ventricular Rate:    PR Interval:    QRS Duration:   QT Interval:    QTC Calculation:   R Axis:     Text Interpretation:         Laboratory Studies: Results for orders placed or performed during the hospital encounter of 07/20/22 (from the past 24 hour(s))  hCG, quantitative, pregnancy     Status: None  Collection Time: 07/20/22 10:59 PM  Result Value Ref Range   hCG, Beta Chain, Quant, S <1 <5 mIU/mL  Lipase, blood     Status: None   Collection Time: 07/20/22 11:00 PM  Result Value Ref Range   Lipase 19 11 - 51 U/L  Comprehensive metabolic panel     Status: None   Collection Time: 07/20/22 11:00 PM  Result Value Ref Range   Sodium 139 135 - 145 mmol/L   Potassium 3.5 3.5 - 5.1 mmol/L   Chloride 103 98 - 111 mmol/L   CO2 26 22 - 32 mmol/L   Glucose, Bld 78 70 - 99 mg/dL   BUN 17 6 - 20 mg/dL   Creatinine, Ser 0.80 0.44 - 1.00  mg/dL   Calcium 9.5 8.9 - 10.3 mg/dL   Total Protein 8.1 6.5 - 8.1 g/dL   Albumin 4.9 3.5 - 5.0 g/dL   AST 25 15 - 41 U/L   ALT 25 0 - 44 U/L   Alkaline Phosphatase 49 38 - 126 U/L   Total Bilirubin 0.4 0.3 - 1.2 mg/dL   GFR, Estimated >60 >60 mL/min   Anion gap 10 5 - 15  Urinalysis, Routine w reflex microscopic Urine, Clean Catch     Status: Abnormal   Collection Time: 07/20/22 11:00 PM  Result Value Ref Range   Color, Urine YELLOW YELLOW   APPearance CLEAR CLEAR   Specific Gravity, Urine 1.031 (H) 1.005 - 1.030   pH 6.0 5.0 - 8.0   Glucose, UA NEGATIVE NEGATIVE mg/dL   Hgb urine dipstick NEGATIVE NEGATIVE   Bilirubin Urine NEGATIVE NEGATIVE   Ketones, ur 40 (A) NEGATIVE mg/dL   Protein, ur TRACE (A) NEGATIVE mg/dL   Nitrite NEGATIVE NEGATIVE   Leukocytes,Ua SMALL (A) NEGATIVE   RBC / HPF 0-5 0 - 5 RBC/hpf   WBC, UA 6-10 0 - 5 WBC/hpf   Bacteria, UA RARE (A) NONE SEEN   Squamous Epithelial / LPF 0-5 0 - 5   Mucus PRESENT   CBC with Differential     Status: None   Collection Time: 07/20/22 11:00 PM  Result Value Ref Range   WBC 4.8 4.0 - 10.5 K/uL   RBC 4.10 3.87 - 5.11 MIL/uL   Hemoglobin 12.1 12.0 - 15.0 g/dL   HCT 36.1 36.0 - 46.0 %   MCV 88.0 80.0 - 100.0 fL   MCH 29.5 26.0 - 34.0 pg   MCHC 33.5 30.0 - 36.0 g/dL   RDW 14.0 11.5 - 15.5 %   Platelets 313 150 - 400 K/uL   nRBC 0.0 0.0 - 0.2 %   Neutrophils Relative % 43 %   Neutro Abs 2.1 1.7 - 7.7 K/uL   Lymphocytes Relative 44 %   Lymphs Abs 2.1 0.7 - 4.0 K/uL   Monocytes Relative 11 %   Monocytes Absolute 0.5 0.1 - 1.0 K/uL   Eosinophils Relative 1 %   Eosinophils Absolute 0.1 0.0 - 0.5 K/uL   Basophils Relative 1 %   Basophils Absolute 0.0 0.0 - 0.1 K/uL   Immature Granulocytes 0 %   Abs Immature Granulocytes 0.01 0.00 - 0.07 K/uL   Imaging Studies: No results found.  ED COURSE and MDM  Nursing notes, initial and subsequent vitals signs, including pulse oximetry, reviewed and interpreted by  myself.  Vitals:   07/20/22 2236 07/20/22 2238 07/21/22 0045  BP: (!) 121/92  122/87  Pulse: 78  84  Resp: 18  18  Temp: 98.1  F (36.7 C)    SpO2: 100% 100% 97%   Medications  metoCLOPramide (REGLAN) injection 10 mg (10 mg Intravenous Given 07/21/22 0028)  pantoprazole (PROTONIX) injection 40 mg (40 mg Intravenous Given 07/21/22 0030)  sodium chloride 0.9 % bolus 1,000 mL (0 mLs Intravenous Stopped 07/21/22 0215)  sodium chloride 0.9 % bolus 1,000 mL (1,000 mLs Intravenous New Bag/Given 07/21/22 0217)   2:38 AM Patient hydrated with 2 L of normal saline.  She is able to drink fluids without vomiting.  I suspect this represents an exacerbation of her known gastritis.  She is already on a PPI.  We will add a brief course of Carafate as well.   PROCEDURES  Procedures   ED DIAGNOSES     ICD-10-CM   1. Nausea and vomiting in adult  R11.2          Xela Oregel, Jonny Ruiz, MD 07/21/22 [phone]

## 2022-07-21 DIAGNOSIS — R112 Nausea with vomiting, unspecified: Secondary | ICD-10-CM | POA: Diagnosis not present

## 2022-07-21 LAB — URINALYSIS, ROUTINE W REFLEX MICROSCOPIC
Bilirubin Urine: NEGATIVE
Glucose, UA: NEGATIVE mg/dL
Hgb urine dipstick: NEGATIVE
Ketones, ur: 40 mg/dL — AB
Nitrite: NEGATIVE
Specific Gravity, Urine: 1.031 — ABNORMAL HIGH (ref 1.005–1.030)
pH: 6 (ref 5.0–8.0)

## 2022-07-21 LAB — HCG, QUANTITATIVE, PREGNANCY: hCG, Beta Chain, Quant, S: <1 m[IU]/mL (ref <5)

## 2022-07-21 MED ORDER — SUCRALFATE 1 G PO TABS: 1.0000 g | ORAL_TABLET | Freq: Three times a day (TID) | ORAL | 1 refills | Status: DC

## 2022-07-21 MED ORDER — ONDANSETRON HCL 8 MG PO TABS: 8.0000 mg | ORAL_TABLET | Freq: Three times a day (TID) | ORAL | 0 refills | Status: DC | PRN

## 2022-07-21 MED ORDER — SODIUM CHLORIDE 0.9 % IV BOLUS
1000.0000 mL | Freq: Once | INTRAVENOUS | Status: AC
  Administered 2022-07-21: 1000 mL via INTRAVENOUS

## 2022-07-21 NOTE — ED Notes (Signed)
Pt reports feeling better and is aware that a urine sample is still needed.

## 2022-07-21 NOTE — ED Notes (Signed)
Pt became restless and agitated after receiving reglan. Spoke with Dr. Read Drivers, and explained to pt that this is transient and will go away in a few minutes. Pt resting with family at bedside. Will continue to monitor.

## 2022-07-21 NOTE — ED Notes (Signed)
Pt has provided a urine sample at this time.

## 2022-07-29 ENCOUNTER — Ambulatory Visit
Admission: EM | Admit: 2022-07-29 | Discharge: 2022-07-29 | Disposition: A | Discharge status: Home / Self Care | Payer: Medicaid Other | Attending: Physician Assistant | Admitting: Physician Assistant

## 2022-07-29 DIAGNOSIS — J019 Acute sinusitis, unspecified: Secondary | ICD-10-CM

## 2022-07-29 MED ORDER — AMOXICILLIN-POT CLAVULANATE 875-125 MG PO TABS: 1.0000 | ORAL_TABLET | Freq: Two times a day (BID) | ORAL | 0 refills | Status: DC

## 2022-07-29 NOTE — ED Provider Notes (Signed)
[name]    CSN: [id number] Arrival date & time: 07/29/22  1723      History   Chief Complaint Chief Complaint  Patient presents with   URI   Emesis    HPI Melanie Stephenson is a 21 y.o. female.   Patient here today for evaluation of postnasal drip, nasal congestion, productive cough that causes vomiting that has been going on for the last week.  She has not checked herself for COVID.  She denies treatment for symptoms  The history is provided by the patient.  URI Emesis Associated symptoms: URI     Past Medical History:  Diagnosis Date   ADD (attention deficit disorder)    Mild asthma     Patient Active Problem List   Diagnosis Date Noted   ADD (attention deficit disorder)     History reviewed. No pertinent surgical history.  OB History   No obstetric history on file.      Home Medications    Prior to Admission medications   Medication Sig Start Date End Date Taking? Authorizing Provider  amoxicillin-clavulanate (AUGMENTIN) 875-125 MG tablet Take 1 tablet by mouth every 12 (twelve) hours. 07/29/22  Yes Tomi Bamberger, PA-C  cetirizine (ZYRTEC) 10 MG tablet Take by mouth.    [provider]  famotidine (PEPCID) 20 MG tablet Take 1 tablet (20 mg total) by mouth at bedtime. 06/15/22   [name], FNP  omeprazole (PRILOSEC) 40 MG capsule Take 1 capsule (40 mg total) by mouth 2 (two) times daily. Prilosec ( omeprazole ) 40 mg twice daily for 4 weeks. Then reduce back to 20 mg twice daily and continue to titrate to lowest effective dose, or discontinue if symptoms resolve. 07/07/22   Cirigliano, Vito V, DO  ondansetron (ZOFRAN) 8 MG tablet Take 1 tablet (8 mg total) by mouth every 8 (eight) hours as needed for nausea or vomiting. 07/21/22   Molpus, John, MD  sucralfate (CARAFATE) 1 g tablet Take 1 tablet (1 g total) by mouth 4 (four) times daily -  with meals and at bedtime. Take omeprazole (Prilosec) at least 30 minutes before first morning dose  of this medication. 07/21/22   Molpus, Jonny Ruiz, MD    Family History Family History  Problem Relation Age of Onset   Healthy Other     Social History Social History   Tobacco Use   Smoking status: Never  Substance Use Topics   Alcohol use: Not Currently   Drug use: Not Currently     Allergies   Other and Tilactase   Review of Systems Review of Systems  Gastrointestinal:  Positive for vomiting.     Physical Exam Triage Vital Signs ED Triage Vitals  Enc Vitals Group     BP 07/29/22 1822 111/78     Pulse Rate 07/29/22 1822 89     Resp 07/29/22 1822 18     Temp 07/29/22 1822 98.2 F (36.8 C)     Temp Source 07/29/22 1822 Oral     SpO2 07/29/22 1822 98 %     Weight --      Height --      Head Circumference --      Peak Flow --      Pain Score 07/29/22 1821 2     Pain Loc --      Pain Edu? --      Excl. in GC? --    No data found.  Updated Vital Signs BP 111/78 (BP  Location: Right Arm)   Pulse 89   Temp 98.2 F (36.8 C) (Oral)   Resp 18   SpO2 98%      Physical Exam Vitals and nursing note reviewed.  Constitutional:      General: She is not in acute distress.    Appearance: Normal appearance. She is not ill-appearing.  HENT:     Head: Normocephalic and atraumatic.     Nose: Congestion present.     Mouth/Throat:     Pharynx: Oropharynx is clear. No oropharyngeal exudate or posterior oropharyngeal erythema.  Eyes:     Conjunctiva/sclera: Conjunctivae normal.  Cardiovascular:     Rate and Rhythm: Normal rate and regular rhythm.     Heart sounds: Normal heart sounds. No murmur heard. Pulmonary:     Effort: Pulmonary effort is normal. No respiratory distress.     Breath sounds: Normal breath sounds. No wheezing, rhonchi or rales.  Neurological:     Mental Status: She is alert.  Psychiatric:        Mood and Affect: Mood normal.      UC Treatments / Results  Labs (all labs ordered are listed, but only abnormal results are displayed) Labs  Reviewed - No data to display  EKG   Radiology No results found.  Procedures Procedures (including critical care time)  Medications Ordered in UC Medications - No data to display  Initial Impression / Assessment and Plan / UC Course  I have reviewed the triage vital signs and the nursing notes.  Pertinent labs & imaging results that were available during my care of the patient were reviewed by me and considered in my medical decision making (see chart for details).    Given duration of symptoms will treat to cover sinus infection.  Antibiotic prescribed.  Encouraged follow-up with any further concerns.  Final Clinical Impressions(s) / UC Diagnoses   Final diagnoses:  Acute sinusitis, recurrence not specified, unspecified location   Discharge Instructions   None    ED Prescriptions     Medication Sig Dispense Auth. Provider   amoxicillin-clavulanate (AUGMENTIN) 875-125 MG tablet Take 1 tablet by mouth every 12 (twelve) hours. 14 tablet Tomi Bamberger, PA-C      PDMP not reviewed this encounter.   Tomi Bamberger, PA-C 07/29/22 [phone]

## 2022-07-29 NOTE — ED Triage Notes (Signed)
Pt presents with post nasal drainage, productive cough, and vomiting X 1 week.

## 2022-08-27 ENCOUNTER — Ambulatory Visit
Admission: EM | Admit: 2022-08-27 | Discharge: 2022-08-27 | Disposition: A | Discharge status: Home / Self Care | Payer: Medicaid Other | Attending: Physician Assistant | Admitting: Physician Assistant

## 2022-08-27 ENCOUNTER — Encounter: Payer: Self-pay | Admitting: Physician Assistant

## 2022-08-27 DIAGNOSIS — J4520 Mild intermittent asthma, uncomplicated: Secondary | ICD-10-CM | POA: Diagnosis not present

## 2022-08-27 MED ORDER — ALBUTEROL SULFATE HFA 108 (90 BASE) MCG/ACT IN AERS: 1.0000 | INHALATION_SPRAY | Freq: Four times a day (QID) | RESPIRATORY_TRACT | 0 refills | Status: DC | PRN

## 2022-08-27 NOTE — ED Provider Notes (Signed)
[name]    CSN: [id number] Arrival date & time: 08/27/22  1737      History   Chief Complaint Chief Complaint  Patient presents with   Asthma    HPI Melanie Stephenson is a 21 y.o. female.   Here today for evaluation of possible asthma exacerbation.  She reports that she has had some shortness of breath and wheezing and this occurs in the warm months.  She has not had inhaler for a few years but states she feels she might benefit from one at this time.  She denies any fever.  She has not had any significant cough or congestion.  She has not been taking any medication for symptoms.  The history is provided by the patient.  Asthma Pertinent negatives include no shortness of breath.    Past Medical History:  Diagnosis Date   ADD (attention deficit disorder)    Mild asthma     Patient Active Problem List   Diagnosis Date Noted   ADD (attention deficit disorder)     History reviewed. No pertinent surgical history.  OB History   No obstetric history on file.      Home Medications    Prior to Admission medications   Medication Sig Start Date End Date Taking? Authorizing Provider  albuterol (VENTOLIN HFA) 108 (90 Base) MCG/ACT inhaler Inhale 1-2 puffs into the lungs every 6 (six) hours as needed for wheezing or shortness of breath. 08/27/22  Yes Tomi Bamberger, PA-C  amoxicillin-clavulanate (AUGMENTIN) 875-125 MG tablet Take 1 tablet by mouth every 12 (twelve) hours. 07/29/22   Tomi Bamberger, PA-C  cetirizine (ZYRTEC) 10 MG tablet Take by mouth.    [provider]  famotidine (PEPCID) 20 MG tablet Take 1 tablet (20 mg total) by mouth at bedtime. 06/15/22   [name], FNP  omeprazole (PRILOSEC) 40 MG capsule Take 1 capsule (40 mg total) by mouth 2 (two) times daily. Prilosec ( omeprazole ) 40 mg twice daily for 4 weeks. Then reduce back to 20 mg twice daily and continue to titrate to lowest effective dose, or discontinue if symptoms resolve. 07/07/22    Cirigliano, Vito V, DO  ondansetron (ZOFRAN) 8 MG tablet Take 1 tablet (8 mg total) by mouth every 8 (eight) hours as needed for nausea or vomiting. 07/21/22   Molpus, John, MD  sucralfate (CARAFATE) 1 g tablet Take 1 tablet (1 g total) by mouth 4 (four) times daily -  with meals and at bedtime. Take omeprazole (Prilosec) at least 30 minutes before first morning dose of this medication. 07/21/22   Molpus, Jonny Ruiz, MD    Family History Family History  Problem Relation Age of Onset   Healthy Other     Social History Social History   Tobacco Use   Smoking status: Never  Substance Use Topics   Alcohol use: Not Currently   Drug use: Not Currently     Allergies   Other and Tilactase   Review of Systems Review of Systems  Constitutional:  Negative for chills and fever.  HENT:  Negative for congestion and sore throat.   Eyes:  Negative for discharge and redness.  Respiratory:  Negative for cough, shortness of breath and wheezing.      Physical Exam Triage Vital Signs ED Triage Vitals  Enc Vitals Group     BP 08/27/22 1752 120/86     Pulse Rate 08/27/22 1752 81     Resp 08/27/22 1752 20  Temp 08/27/22 1752 97.6 F (36.4 C)     Temp src --      SpO2 08/27/22 1752 98 %     Weight --      Height --      Head Circumference --      Peak Flow --      Pain Score 08/27/22 1754 0     Pain Loc --      Pain Edu? --      Excl. in GC? --    No data found.  Updated Vital Signs BP 120/86   Pulse 81   Temp 97.6 F (36.4 C)   Resp 20   LMP 03/03/2022 (Approximate)   SpO2 98%      Physical Exam Vitals and nursing note reviewed.  Constitutional:      General: She is not in acute distress.    Appearance: Normal appearance. She is not ill-appearing.  HENT:     Head: Normocephalic and atraumatic.     Nose: No congestion or rhinorrhea.  Eyes:     Conjunctiva/sclera: Conjunctivae normal.  Cardiovascular:     Rate and Rhythm: Normal rate and regular rhythm.     Heart sounds:  Normal heart sounds. No murmur heard. Pulmonary:     Effort: Pulmonary effort is normal. No respiratory distress.     Breath sounds: Normal breath sounds. No wheezing, rhonchi or rales.  Skin:    General: Skin is warm and dry.  Neurological:     Mental Status: She is alert.  Psychiatric:        Mood and Affect: Mood normal.        Thought Content: Thought content normal.      UC Treatments / Results  Labs (all labs ordered are listed, but only abnormal results are displayed) Labs Reviewed - No data to display  EKG   Radiology No results found.  Procedures Procedures (including critical care time)  Medications Ordered in UC Medications - No data to display  Initial Impression / Assessment and Plan / UC Course  I have reviewed the triage vital signs and the nursing notes.  Pertinent labs & imaging results that were available during my care of the patient were reviewed by me and considered in my medical decision making (see chart for details).   Albuterol inhaler prescribed for treatment of mild intermittent asthma.  Recommended follow-up with any further concerns or worsening symptoms.  Final Clinical Impressions(s) / UC Diagnoses   Final diagnoses:  Mild intermittent asthma without complication   Discharge Instructions   None    ED Prescriptions     Medication Sig Dispense Auth. Provider   albuterol (VENTOLIN HFA) 108 (90 Base) MCG/ACT inhaler Inhale 1-2 puffs into the lungs every 6 (six) hours as needed for wheezing or shortness of breath. 8 g Tomi Bamberger, PA-C      PDMP not reviewed this encounter.   Tomi Bamberger, PA-C 08/27/22 [phone]

## 2022-08-27 NOTE — ED Triage Notes (Signed)
Pt presents to uc with co of asthma exacerbation. Pt reports she has no pcp at the moment and used to take albuterol for symptoms but has not prescription at the moment.

## 2022-09-14 ENCOUNTER — Ambulatory Visit: Payer: Medicaid Other | Admitting: Gastroenterology

## 2022-09-22 ENCOUNTER — Ambulatory Visit
Admission: EM | Admit: 2022-09-22 | Discharge: 2022-09-22 | Disposition: A | Discharge status: Home / Self Care | Payer: Medicaid Other | Attending: Physician Assistant | Admitting: Physician Assistant

## 2022-09-22 DIAGNOSIS — J029 Acute pharyngitis, unspecified: Secondary | ICD-10-CM | POA: Diagnosis not present

## 2022-09-22 DIAGNOSIS — J309 Allergic rhinitis, unspecified: Secondary | ICD-10-CM

## 2022-09-22 LAB — POCT RAPID STREP A (OFFICE): Rapid Strep A Screen: NEGATIVE

## 2022-09-22 NOTE — ED Provider Notes (Signed)
[name]    CSN: [id number] Arrival date & time: 09/22/22  1856      History   Chief Complaint Chief Complaint  Patient presents with   Sore Throat    HPI Melanie Stephenson is a 21 y.o. female.   The history is provided by the patient.  Sore Throat    Past Medical History:  Diagnosis Date   ADD (attention deficit disorder)    Mild asthma     Patient Active Problem List   Diagnosis Date Noted   ADD (attention deficit disorder)     History reviewed. No pertinent surgical history.  OB History   No obstetric history on file.      Home Medications    Prior to Admission medications   Medication Sig Start Date End Date Taking? Authorizing Provider  albuterol (VENTOLIN HFA) 108 (90 Base) MCG/ACT inhaler Inhale 1-2 puffs into the lungs every 6 (six) hours as needed for wheezing or shortness of breath. 08/27/22   Tomi Bamberger, PA-C  amoxicillin-clavulanate (AUGMENTIN) 875-125 MG tablet Take 1 tablet by mouth every 12 (twelve) hours. 07/29/22   Tomi Bamberger, PA-C  cetirizine (ZYRTEC) 10 MG tablet Take by mouth.    [provider]  famotidine (PEPCID) 20 MG tablet Take 1 tablet (20 mg total) by mouth at bedtime. 06/15/22   [name], FNP  omeprazole (PRILOSEC) 40 MG capsule Take 1 capsule (40 mg total) by mouth 2 (two) times daily. Prilosec ( omeprazole ) 40 mg twice daily for 4 weeks. Then reduce back to 20 mg twice daily and continue to titrate to lowest effective dose, or discontinue if symptoms resolve. 07/07/22   Cirigliano, Vito V, DO  ondansetron (ZOFRAN) 8 MG tablet Take 1 tablet (8 mg total) by mouth every 8 (eight) hours as needed for nausea or vomiting. 07/21/22   Molpus, John, MD  sucralfate (CARAFATE) 1 g tablet Take 1 tablet (1 g total) by mouth 4 (four) times daily -  with meals and at bedtime. Take omeprazole (Prilosec) at least 30 minutes before first morning dose of this medication. 07/21/22   Molpus, Jonny Ruiz, MD    Family  History Family History  Problem Relation Age of Onset   Healthy Other     Social History Social History   Tobacco Use   Smoking status: Never  Substance Use Topics   Alcohol use: Not Currently   Drug use: Not Currently     Allergies   Other and Tilactase   Review of Systems Review of Systems   Physical Exam Triage Vital Signs ED Triage Vitals  Enc Vitals Group     BP 09/22/22 1933 118/68     Pulse Rate 09/22/22 1933 81     Resp 09/22/22 1933 17     Temp 09/22/22 1933 98.3 F (36.8 C)     Temp Source 09/22/22 1933 Oral     SpO2 09/22/22 1933 98 %     Weight --      Height --      Head Circumference --      Peak Flow --      Pain Score 09/22/22 1937 7     Pain Loc --      Pain Edu? --      Excl. in GC? --    No data found.  Updated Vital Signs BP 118/68 (BP Location: Left Arm)   Pulse 81   Temp 98.3 F (36.8 C) (Oral)   Resp 17  LMP 03/03/2022 (Approximate)   SpO2 98%      Physical Exam   UC Treatments / Results  Labs (all labs ordered are listed, but only abnormal results are displayed) Labs Reviewed - No data to display  EKG   Radiology No results found.  Procedures Procedures (including critical care time)  Medications Ordered in UC Medications - No data to display  Initial Impression / Assessment and Plan / UC Course  I have reviewed the triage vital signs and the nursing notes.  Pertinent labs & imaging results that were available during my care of the patient were reviewed by me and considered in my medical decision making (see chart for details).     *** Final Clinical Impressions(s) / UC Diagnoses   Final diagnoses:  None   Discharge Instructions   None    ED Prescriptions   None    PDMP not reviewed this encounter.

## 2022-09-22 NOTE — ED Triage Notes (Signed)
Pt c/o sore/scratchy throat, onset ~ last Friday.

## 2022-09-23 ENCOUNTER — Encounter: Payer: Self-pay | Admitting: Physician Assistant

## 2022-11-12 ENCOUNTER — Encounter: Payer: Self-pay | Admitting: Physician Assistant

## 2022-11-12 ENCOUNTER — Ambulatory Visit
Admission: EM | Admit: 2022-11-12 | Discharge: 2022-11-12 | Disposition: A | Discharge status: Home / Self Care | Payer: Medicaid Other | Attending: Physician Assistant | Admitting: Physician Assistant

## 2022-11-12 DIAGNOSIS — M25562 Pain in left knee: Secondary | ICD-10-CM | POA: Diagnosis not present

## 2022-11-12 MED ORDER — IBUPROFEN 600 MG PO TABS: 600.0000 mg | ORAL_TABLET | Freq: Four times a day (QID) | ORAL | 0 refills | Status: DC | PRN

## 2022-11-12 NOTE — ED Provider Notes (Signed)
[name]    CSN: [id number] Arrival date & time: 11/12/22  1735      History   Chief Complaint Chief Complaint  Patient presents with   Knee Pain    HPI Melanie Stephenson is a 21 y.o. female.   Patient here today for evaluation of left-sided knee pain that comes and goes.  She reports that she has had pain for the last couple weeks that is gradually worsened with time.  Working makes pain worse.  She has used ice and heat with minimal relief.  She has not taken any medication for symptoms.  She denies any new or old injury to her knee.  She denies any numbness or tingling.  She rates her pain as a 9 out of 10 at its worst.  The history is provided by the patient.  Knee Pain Associated symptoms: no fever     Past Medical History:  Diagnosis Date   ADD (attention deficit disorder)    Mild asthma     Patient Active Problem List   Diagnosis Date Noted   ADD (attention deficit disorder)     History reviewed. No pertinent surgical history.  OB History   No obstetric history on file.      Home Medications    Prior to Admission medications   Medication Sig Start Date End Date Taking? Authorizing Provider  ibuprofen (ADVIL) 600 MG tablet Take 1 tablet (600 mg total) by mouth every 6 (six) hours as needed. 11/12/22  Yes Tomi Bamberger, PA-C  albuterol (VENTOLIN HFA) 108 (90 Base) MCG/ACT inhaler Inhale 1-2 puffs into the lungs every 6 (six) hours as needed for wheezing or shortness of breath. 08/27/22   Tomi Bamberger, PA-C  amoxicillin-clavulanate (AUGMENTIN) 875-125 MG tablet Take 1 tablet by mouth every 12 (twelve) hours. 07/29/22   Tomi Bamberger, PA-C  cetirizine (ZYRTEC) 10 MG tablet Take by mouth.    [provider]  famotidine (PEPCID) 20 MG tablet Take 1 tablet (20 mg total) by mouth at bedtime. 06/15/22   [name], FNP  omeprazole (PRILOSEC) 40 MG capsule Take 1 capsule (40 mg total) by mouth 2 (two) times daily. Prilosec ( omeprazole )  40 mg twice daily for 4 weeks. Then reduce back to 20 mg twice daily and continue to titrate to lowest effective dose, or discontinue if symptoms resolve. 07/07/22   Cirigliano, Vito V, DO  ondansetron (ZOFRAN) 8 MG tablet Take 1 tablet (8 mg total) by mouth every 8 (eight) hours as needed for nausea or vomiting. 07/21/22   Molpus, John, MD  sucralfate (CARAFATE) 1 g tablet Take 1 tablet (1 g total) by mouth 4 (four) times daily -  with meals and at bedtime. Take omeprazole (Prilosec) at least 30 minutes before first morning dose of this medication. 07/21/22   Molpus, Jonny Ruiz, MD    Family History Family History  Problem Relation Age of Onset   Healthy Other     Social History Social History   Tobacco Use   Smoking status: Never  Substance Use Topics   Alcohol use: Not Currently   Drug use: Not Currently     Allergies   Other and Tilactase   Review of Systems Review of Systems  Constitutional:  Negative for chills and fever.  Eyes:  Negative for discharge and redness.  Respiratory:  Negative for shortness of breath.   Musculoskeletal:  Positive for arthralgias. Negative for joint swelling.  Neurological:  Negative for numbness.  Physical Exam Triage Vital Signs ED Triage Vitals  Enc Vitals Group     BP 11/12/22 1827 121/79     Pulse Rate 11/12/22 1827 85     Resp 11/12/22 1827 16     Temp 11/12/22 1827 97.7 F (36.5 C)     Temp Source 11/12/22 1827 Oral     SpO2 11/12/22 1827 98 %     Weight --      Height --      Head Circumference --      Peak Flow --      Pain Score 11/12/22 1825 9     Pain Loc --      Pain Edu? --      Excl. in GC? --    No data found.  Updated Vital Signs BP 121/79   Pulse 85   Temp 97.7 F (36.5 C) (Oral)   Resp 16   LMP  (Within Months) Comment: 5 months ago  SpO2 98%   Visual Acuity Right Eye Distance:   Left Eye Distance:   Bilateral Distance:    Right Eye Near:   Left Eye Near:    Bilateral Near:     Physical  Exam Vitals and nursing note reviewed.  Constitutional:      General: She is not in acute distress.    Appearance: Normal appearance. She is not ill-appearing.  HENT:     Head: Normocephalic and atraumatic.  Eyes:     Conjunctiva/sclera: Conjunctivae normal.  Cardiovascular:     Rate and Rhythm: Normal rate.  Pulmonary:     Effort: Pulmonary effort is normal. No respiratory distress.  Musculoskeletal:     Comments: Full ROM of left knee. TTP noted to medial anterior joint line only.   Neurological:     Mental Status: She is alert.  Psychiatric:        Mood and Affect: Mood normal.        Behavior: Behavior normal.        Thought Content: Thought content normal.      UC Treatments / Results  Labs (all labs ordered are listed, but only abnormal results are displayed) Labs Reviewed - No data to display  EKG   Radiology No results found.  Procedures Procedures (including critical care time)  Medications Ordered in UC Medications - No data to display  Initial Impression / Assessment and Plan / UC Course  I have reviewed the triage vital signs and the nursing notes.  Pertinent labs & imaging results that were available during my care of the patient were reviewed by me and considered in my medical decision making (see chart for details).    Will trial regularly dosed anti-inflammatory (ibuprofen) and follow up with ortho if no gradual improvement over the next week.   Final Clinical Impressions(s) / UC Diagnoses   Final diagnoses:  Acute pain of left knee   Discharge Instructions   None    ED Prescriptions     Medication Sig Dispense Auth. Provider   ibuprofen (ADVIL) 600 MG tablet Take 1 tablet (600 mg total) by mouth every 6 (six) hours as needed. 30 tablet Tomi Bamberger, PA-C      PDMP not reviewed this encounter.   Tomi Bamberger, PA-C 11/12/22 1932

## 2022-11-12 NOTE — ED Triage Notes (Signed)
Pt presents to uc with co of L sided knee pain that comes and goes and is worse when at work pain is 9/10 and has used ice and heat which helps some. No old injury

## 2022-12-03 ENCOUNTER — Ambulatory Visit
Admission: EM | Admit: 2022-12-03 | Discharge: 2022-12-03 | Disposition: A | Discharge status: Home / Self Care | Payer: Medicaid Other

## 2022-12-03 DIAGNOSIS — B079 Viral wart, unspecified: Secondary | ICD-10-CM | POA: Diagnosis not present

## 2022-12-03 DIAGNOSIS — B078 Other viral warts: Secondary | ICD-10-CM

## 2022-12-03 NOTE — ED Triage Notes (Signed)
Pt presents to uc with co of L pinky finger pain from a wart. Pt reports she has had this for a [place] time but recently it has started to open up and has been burning.

## 2022-12-03 NOTE — Discharge Instructions (Signed)
Please follow-up with dermatology for further evaluation and management.

## 2022-12-03 NOTE — ED Provider Notes (Signed)
[name]    CSN: [id number] Arrival date & time: 12/03/22  1630      History   Chief Complaint Chief Complaint  Patient presents with   Warts    HPI Melanie Stephenson is a 21 y.o. female.   Patient presents with concern for wart on her right pinky finger.  Patient reports that it has been present since childhood but is recently causing her some discomfort.  She states that when she hits it on something at work it starts to bleed.  Denies numbness or tingling.  Patient has full range of motion of finger.  Patient has never been seen by a healthcare provider for the wart.     Past Medical History:  Diagnosis Date   ADD (attention deficit disorder)    Mild asthma     Patient Active Problem List   Diagnosis Date Noted   ADD (attention deficit disorder)     History reviewed. No pertinent surgical history.  OB History   No obstetric history on file.      Home Medications    Prior to Admission medications   Medication Sig Start Date End Date Taking? Authorizing Provider  albuterol (VENTOLIN HFA) 108 (90 Base) MCG/ACT inhaler Inhale 1-2 puffs into the lungs every 6 (six) hours as needed for wheezing or shortness of breath. 08/27/22   Tomi Bamberger, PA-C  amoxicillin-clavulanate (AUGMENTIN) 875-125 MG tablet Take 1 tablet by mouth every 12 (twelve) hours. 07/29/22   Tomi Bamberger, PA-C  cetirizine (ZYRTEC) 10 MG tablet Take by mouth.    [provider]  famotidine (PEPCID) 20 MG tablet Take 1 tablet (20 mg total) by mouth at bedtime. 06/15/22   [name], FNP  ibuprofen (ADVIL) 600 MG tablet Take 1 tablet (600 mg total) by mouth every 6 (six) hours as needed. 11/12/22   Tomi Bamberger, PA-C  omeprazole (PRILOSEC) 40 MG capsule Take 1 capsule (40 mg total) by mouth 2 (two) times daily. Prilosec ( omeprazole ) 40 mg twice daily for 4 weeks. Then reduce back to 20 mg twice daily and continue to titrate to lowest effective dose, or discontinue if  symptoms resolve. 07/07/22   Cirigliano, Vito V, DO  ondansetron (ZOFRAN) 8 MG tablet Take 1 tablet (8 mg total) by mouth every 8 (eight) hours as needed for nausea or vomiting. 07/21/22   Molpus, John, MD  sucralfate (CARAFATE) 1 g tablet Take 1 tablet (1 g total) by mouth 4 (four) times daily -  with meals and at bedtime. Take omeprazole (Prilosec) at least 30 minutes before first morning dose of this medication. 07/21/22   Molpus, Jonny Ruiz, MD    Family History Family History  Problem Relation Age of Onset   Healthy Other     Social History Social History   Tobacco Use   Smoking status: Never  Substance Use Topics   Alcohol use: Not Currently   Drug use: Not Currently     Allergies   Other and Tilactase   Review of Systems Review of Systems Per HPI  Physical Exam Triage Vital Signs ED Triage Vitals  Enc Vitals Group     BP 12/03/22 1727 116/78     Pulse --      Resp 12/03/22 1727 20     Temp 12/03/22 1727 98.6 F (37 C)     Temp src --      SpO2 12/03/22 1727 98 %     Weight --  Height --      Head Circumference --      Peak Flow --      Pain Score 12/03/22 1730 5     Pain Loc --      Pain Edu? --      Excl. in GC? --    No data found.  Updated Vital Signs BP 116/78   Temp 98.6 F (37 C)   Resp 20   LMP  (Within Months)   SpO2 98%   Visual Acuity Right Eye Distance:   Left Eye Distance:   Bilateral Distance:    Right Eye Near:   Left Eye Near:    Bilateral Near:     Physical Exam Constitutional:      General: She is not in acute distress.    Appearance: Normal appearance. She is not toxic-appearing or diaphoretic.  HENT:     Head: Normocephalic and atraumatic.  Eyes:     Extraocular Movements: Extraocular movements intact.     Conjunctiva/sclera: Conjunctivae normal.  Pulmonary:     Effort: Pulmonary effort is normal.  Skin:    Comments: Approximately 2 mm in diameter wart present to lateral portion of right pinky finger.  No bleeding or  purulent drainage is noted.  No surrounding swelling.  Neurological:     General: No focal deficit present.     Mental Status: She is alert and oriented to person, place, and time. Mental status is at baseline.  Psychiatric:        Mood and Affect: Mood normal.        Behavior: Behavior normal.        Thought Content: Thought content normal.        Judgment: Judgment normal.      UC Treatments / Results  Labs (all labs ordered are listed, but only abnormal results are displayed) Labs Reviewed - No data to display  EKG   Radiology No results found.  Procedures Procedures (including critical care time)  Medications Ordered in UC Medications - No data to display  Initial Impression / Assessment and Plan / UC Course  I have reviewed the triage vital signs and the nursing notes.  Pertinent labs & imaging results that were available during my care of the patient were reviewed by me and considered in my medical decision making (see chart for details).     Patient has appearance of wart to right pinky finger.  No concern for infection or complication to surrounding skin.  Patient wishes to have it removed given that is causing her discomfort.  Therefore, advised patient to follow-up with provided contact information for dermatology for further evaluation and management.  Patient was agreeable with plan. Final Clinical Impressions(s) / UC Diagnoses   Final diagnoses:  Viral wart on finger     Discharge Instructions      Please follow-up with dermatology for further evaluation and management.    ED Prescriptions   None    PDMP not reviewed this encounter.   [name], [place] 12/03/22 [id number]

## 2022-12-05 ENCOUNTER — Ambulatory Visit
Admission: EM | Admit: 2022-12-05 | Discharge: 2022-12-05 | Disposition: A | Discharge status: Home / Self Care | Payer: Medicaid Other | Attending: Internal Medicine | Admitting: Internal Medicine

## 2022-12-05 DIAGNOSIS — J069 Acute upper respiratory infection, unspecified: Secondary | ICD-10-CM | POA: Diagnosis not present

## 2022-12-05 DIAGNOSIS — R21 Rash and other nonspecific skin eruption: Secondary | ICD-10-CM | POA: Diagnosis not present

## 2022-12-05 MED ORDER — BENZONATATE 100 MG PO CAPS: 100.0000 mg | ORAL_CAPSULE | Freq: Three times a day (TID) | ORAL | 0 refills | Status: DC | PRN

## 2022-12-05 MED ORDER — FLUTICASONE PROPIONATE 50 MCG/ACT NA SUSP: 1.0000 | Freq: Every day | NASAL | 0 refills | Status: DC

## 2022-12-05 MED ORDER — ALBUTEROL SULFATE HFA 108 (90 BASE) MCG/ACT IN AERS: 1.0000 | INHALATION_SPRAY | Freq: Four times a day (QID) | RESPIRATORY_TRACT | 0 refills | Status: DC | PRN

## 2022-12-05 MED ORDER — TRIAMCINOLONE ACETONIDE 0.1 % EX CREA: 1.0000 | TOPICAL_CREAM | Freq: Two times a day (BID) | CUTANEOUS | 0 refills | Status: DC

## 2022-12-05 NOTE — Discharge Instructions (Signed)
It appears that you have a viral upper respiratory infection which should run its course and self resolve with symptomatic treatment as we discussed.  I have prescribed a few different medications including refill on your inhaler to alleviate symptoms.  I have also prescribed you a cream to apply to your rash.  Please follow-up if any symptoms persist or worsen.

## 2022-12-05 NOTE — ED Provider Notes (Signed)
[name]    CSN: [id number] Arrival date & time: 12/05/22  1415      History   Chief Complaint No chief complaint on file.   HPI Melanie Stephenson is a 21 y.o. female.   Patient presents with cough and nasal congestion that started approximately 5 days ago.  Patient denies any known sick contacts or fever.  Patient is concerned for asthma exacerbation given that she has been using her albuterol inhaler intermittently.  Although, she reports albuterol inhaler has been helpful.  Denies any current chest tightness, shortness of breath, wheezing, chest pain.  Denies sore throat, ear pain, nausea, vomiting, diarrhea, abdominal pain.  Patient has not taken any other over-the-counter medications to alleviate symptoms.  Patient also has rash to left abdomen and bilateral posterior thighs that has been present for a few days.  Patient reports rash is itchy and she concerned for eczema especially given that she has history of asthma.  Patient has not used any over-the-counter medications or topical medications to help alleviate symptoms.  Denies any changes to lotions, soaps, detergents, foods, etc.  Denies any known sick contacts with similar rash.     Past Medical History:  Diagnosis Date   ADD (attention deficit disorder)    Mild asthma     Patient Active Problem List   Diagnosis Date Noted   ADD (attention deficit disorder)     No past surgical history on file.  OB History   No obstetric history on file.      Home Medications    Prior to Admission medications   Medication Sig Start Date End Date Taking? Authorizing Provider  benzonatate (TESSALON) 100 MG capsule Take 1 capsule (100 mg total) by mouth every 8 (eight) hours as needed for cough. 12/05/22  Yes Tracee Mccreery, Rolly Salter E, FNP  fluticasone (FLONASE) 50 MCG/ACT nasal spray Place 1 spray into both nostrils daily. 12/05/22  Yes German Manke, Rolly Salter E, FNP  triamcinolone cream (KENALOG) 0.1 % Apply 1 Application topically 2 (two)  times daily. 12/05/22  Yes Callyn Severtson, Acie Fredrickson, FNP  albuterol (VENTOLIN HFA) 108 (90 Base) MCG/ACT inhaler Inhale 1-2 puffs into the lungs every 6 (six) hours as needed for wheezing or shortness of breath. 12/05/22   [name], FNP  amoxicillin-clavulanate (AUGMENTIN) 875-125 MG tablet Take 1 tablet by mouth every 12 (twelve) hours. 07/29/22   Tomi Bamberger, PA-C  cetirizine (ZYRTEC) 10 MG tablet Take by mouth.    [provider]  famotidine (PEPCID) 20 MG tablet Take 1 tablet (20 mg total) by mouth at bedtime. 06/15/22   [name], FNP  ibuprofen (ADVIL) 600 MG tablet Take 1 tablet (600 mg total) by mouth every 6 (six) hours as needed. 11/12/22   Tomi Bamberger, PA-C  omeprazole (PRILOSEC) 40 MG capsule Take 1 capsule (40 mg total) by mouth 2 (two) times daily. Prilosec ( omeprazole ) 40 mg twice daily for 4 weeks. Then reduce back to 20 mg twice daily and continue to titrate to lowest effective dose, or discontinue if symptoms resolve. 07/07/22   Cirigliano, Vito V, DO  ondansetron (ZOFRAN) 8 MG tablet Take 1 tablet (8 mg total) by mouth every 8 (eight) hours as needed for nausea or vomiting. 07/21/22   Molpus, John, MD  sucralfate (CARAFATE) 1 g tablet Take 1 tablet (1 g total) by mouth 4 (four) times daily -  with meals and at bedtime. Take omeprazole (Prilosec) at least 30 minutes before first morning dose of  this medication. 07/21/22   Molpus, John, MD    Family History Family History  Problem Relation Age of Onset   Healthy Other     Social History Social History   Tobacco Use   Smoking status: Never  Substance Use Topics   Alcohol use: Not Currently   Drug use: Not Currently     Allergies   Other and Tilactase   Review of Systems Review of Systems Per HPI  Physical Exam Triage Vital Signs ED Triage Vitals [12/05/22 1531]  Enc Vitals Group     BP (!) 144/75     Pulse Rate 82     Resp 16     Temp 97.8 F (36.6 C)     Temp Source Oral     SpO2 98 %      Weight      Height      Head Circumference      Peak Flow      Pain Score 0     Pain Loc      Pain Edu?      Excl. in GC?    No data found.  Updated Vital Signs BP (!) 144/75   Pulse 82   Temp 97.8 F (36.6 C) (Oral)   Resp 16   LMP  (Within Months)   SpO2 98%   Visual Acuity Right Eye Distance:   Left Eye Distance:   Bilateral Distance:    Right Eye Near:   Left Eye Near:    Bilateral Near:     Physical Exam Constitutional:      General: She is not in acute distress.    Appearance: Normal appearance. She is not toxic-appearing or diaphoretic.  HENT:     Head: Normocephalic and atraumatic.     Right Ear: Tympanic membrane and ear canal normal.     Left Ear: Tympanic membrane and ear canal normal.     Nose: Congestion present.     Mouth/Throat:     Mouth: Mucous membranes are moist.     Pharynx: No posterior oropharyngeal erythema.  Eyes:     Extraocular Movements: Extraocular movements intact.     Conjunctiva/sclera: Conjunctivae normal.     Pupils: Pupils are equal, round, and reactive to light.  Cardiovascular:     Rate and Rhythm: Normal rate and regular rhythm.     Pulses: Normal pulses.     Heart sounds: Normal heart sounds.  Pulmonary:     Effort: Pulmonary effort is normal. No respiratory distress.     Breath sounds: Normal breath sounds. No stridor. No wheezing, rhonchi or rales.  Abdominal:     General: Abdomen is flat. Bowel sounds are normal.     Palpations: Abdomen is soft.  Musculoskeletal:        General: Normal range of motion.     Cervical back: Normal range of motion.  Skin:    General: Skin is warm and dry.     Comments: Patient has erythematous, flat mildly scaly rash present to left flank/lower abdomen and bilateral posterior thighs.  Neurological:     General: No focal deficit present.     Mental Status: She is alert and oriented to person, place, and time. Mental status is at baseline.  Psychiatric:        Mood and Affect: Mood  normal.        Behavior: Behavior normal.      UC Treatments / Results  Labs (all labs ordered are listed, but  only abnormal results are displayed) Labs Reviewed - No data to display  EKG   Radiology No results found.  Procedures Procedures (including critical care time)  Medications Ordered in UC Medications - No data to display  Initial Impression / Assessment and Plan / UC Course  I have reviewed the triage vital signs and the nursing notes.  Pertinent labs & imaging results that were available during my care of the patient were reviewed by me and considered in my medical decision making (see chart for details).     Patient presents with symptoms likely from a viral upper respiratory infection. Differential includes bacterial pneumonia, sinusitis, allergic rhinitis, covid 19, flu, RSV. Patient is nontoxic appearing and not in need of emergent medical intervention. Patient declined viral testing.  Low suspicion for asthma exacerbation given appearance on exam and no wheezing.  Patient currently denies shortness of breath, oxygen is normal, and albuterol inhaler has been helpful for her and she has only used it intermittently.  Therefore, will refill albuterol inhaler and patient was encouraged to follow-up if symptoms persist or worsen. Recommended symptom control with medications and supportive care as well.  Patient sent prescriptions.  Rash is consistent with allergic contact dermatitis versus atopic dermatitis.  Will treat with triamcinolone cream.  Return if symptoms fail to improve. Patient states understanding and is agreeable.  Discharged with PCP followup.  Final Clinical Impressions(s) / UC Diagnoses   Final diagnoses:  Viral upper respiratory tract infection with cough  Rash and nonspecific skin eruption     Discharge Instructions      It appears that you have a viral upper respiratory infection which should run its course and self resolve with symptomatic  treatment as we discussed.  I have prescribed a few different medications including refill on your inhaler to alleviate symptoms.  I have also prescribed you a cream to apply to your rash.  Please follow-up if any symptoms persist or worsen.    ED Prescriptions     Medication Sig Dispense Auth. Provider   albuterol (VENTOLIN HFA) 108 (90 Base) MCG/ACT inhaler Inhale 1-2 puffs into the lungs every 6 (six) hours as needed for wheezing or shortness of breath. 8 g [place], Rolly Salter E, [place]   fluticasone [facility]) 50 MCG/ACT nasal spray Place 1 spray into both nostrils daily. 16 g [place], Rolly Salter E, [place]   benzonatate (TESSALON) 100 MG capsule Take 1 capsule (100 mg total) by mouth every 8 (eight) hours as needed for cough. 21 capsule [place], [place] E, [place]   triamcinolone cream (KENALOG) 0.1 % Apply 1 Application topically 2 (two) times daily. 453 g [name], [place]      PDMP not reviewed this encounter.   [name], [place] 12/05/22 [phone]

## 2022-12-05 NOTE — ED Triage Notes (Signed)
Pt presents to uc with co of cough and congestion since Wednesday. Pt reports she is concerned for her asthma. Pt report she is also having excema flair up on the back of her legs stomach and hips.

## 2022-12-15 ENCOUNTER — Ambulatory Visit
Admission: EM | Admit: 2022-12-15 | Discharge: 2022-12-15 | Disposition: A | Discharge status: Home / Self Care | Payer: Medicaid Other | Attending: Emergency Medicine | Admitting: Emergency Medicine

## 2022-12-15 DIAGNOSIS — M940 Chondrocostal junction syndrome [Tietze]: Secondary | ICD-10-CM

## 2022-12-15 MED ORDER — NAPROXEN 500 MG PO TABS: 500.0000 mg | ORAL_TABLET | Freq: Two times a day (BID) | ORAL | 0 refills | Status: DC

## 2022-12-15 MED ORDER — PREDNISONE 20 MG PO TABS: 40.0000 mg | ORAL_TABLET | Freq: Every day | ORAL | 0 refills | Status: AC

## 2022-12-15 NOTE — ED Provider Notes (Signed)
HPI  SUBJECTIVE:  Melanie Stephenson is a 21 y.o. female who presents with 1 month of daily, seconds Pak, sharp, "shocking" left-sided chest pain that occasionally radiates to her back.  She works at [employer] and has increased her hours over the past month.  She has tried Advil with improvement in her symptoms.  Her pain is worse with lifting, use of her arm.  No radiation of this pain up her neck, down her arm, no trauma to the area.  No fevers, coughing, wheezing, shortness of breath, syncope, abdominal pain, calf pain, swelling, recent immobilization, surgery 4 weeks prior to symptoms starting, hemoptysis, exogenous estrogen.  She states that her GERD has been bothering her as well.  Past medical history negative for diabetes, hypertension, CVA, PAD/PVD, hypercholesterolemia, coronary disease, MI, smoking, DVT, PE.  Family history significant for aunt with an MI at age 36.  LMP: 2 months ago.  She is irregular and denies the possibility of being pregnant.  PCP: She is planning to establish care with [facility] primary care.  Patient was seen here on 12/17 for viral URI.  She states that she was having chest pain before those symptoms started.  Past Medical History:  Diagnosis Date   ADD (attention deficit disorder)    Mild asthma     History reviewed. No pertinent surgical history.  Family History  Problem Relation Age of Onset   Healthy Other     Social History   Tobacco Use   Smoking status: Never  Substance Use Topics   Alcohol use: Not Currently   Drug use: Not Currently    No current facility-administered medications for this encounter.  Current Outpatient Medications:    naproxen (NAPROSYN) 500 MG tablet, Take 1 tablet (500 mg total) by mouth 2 (two) times daily., Disp: 20 tablet, Rfl: 0   predniSONE (DELTASONE) 20 MG tablet, Take 2 tablets (40 mg total) by mouth daily with breakfast for 5 days., Disp: 10 tablet, Rfl: 0   albuterol (VENTOLIN HFA) 108 (90 Base) MCG/ACT inhaler, Inhale 1-2  puffs into the lungs every 6 (six) hours as needed for wheezing or shortness of breath., Disp: 8 g, Rfl: 0   cetirizine (ZYRTEC) 10 MG tablet, Take by mouth., Disp: , Rfl:    famotidine (PEPCID) 20 MG tablet, Take 1 tablet (20 mg total) by mouth at bedtime., Disp: 30 tablet, Rfl: 0   fluticasone (FLONASE) 50 MCG/ACT nasal spray, Place 1 spray into both nostrils daily., Disp: 16 g, Rfl: 0   omeprazole (PRILOSEC) 40 MG capsule, Take 1 capsule (40 mg total) by mouth 2 (two) times daily. Prilosec ( omeprazole ) 40 mg twice daily for 4 weeks. Then reduce back to 20 mg twice daily and continue to titrate to lowest effective dose, or discontinue if symptoms resolve., Disp: 90 capsule, Rfl: 3   ondansetron (ZOFRAN) 8 MG tablet, Take 1 tablet (8 mg total) by mouth every 8 (eight) hours as needed for nausea or vomiting., Disp: 10 tablet, Rfl: 0   sucralfate (CARAFATE) 1 g tablet, Take 1 tablet (1 g total) by mouth 4 (four) times daily -  with meals and at bedtime. Take omeprazole (Prilosec) at least 30 minutes before first morning dose of this medication., Disp: 40 tablet, Rfl: 1   triamcinolone cream (KENALOG) 0.1 %, Apply 1 Application topically 2 (two) times daily., Disp: 453 g, Rfl: 0  Allergies  Allergen Reactions   Other     Dairy    Tilactase Rash  ROS  As noted in HPI.   Physical Exam  BP 119/76 (BP Location: Left Arm)   Pulse 75   Temp 97.8 F (36.6 C) (Oral)   Resp 17   SpO2 99%   Constitutional: Well developed, well nourished, no acute distress Eyes:  EOMI, conjunctiva normal bilaterally HENT: Normocephalic, atraumatic,mucus membranes moist Respiratory: Normal inspiratory effort, lungs clear bilaterally Cardiovascular: Normal rate, lungs clear bilaterally.  Positive reproducible tenderness along the sternum bilaterally and left-sided chest wall tenderness costochondral junctions. GI: nondistended skin: No rash, skin intact Musculoskeletal: no deformities, no edema.  Calves  symmetric, nontender Neurologic: Alert & oriented x 3, no focal neuro deficits Psychiatric: Speech and behavior appropriate   ED Course   Medications - No data to display  Orders Placed This Encounter  Procedures   ED EKG    Standing Status:   Standing    Number of Occurrences:   1    Order Specific Question:   Reason for Exam    Answer:   Chest Pain   EKG 12-Lead    Standing Status:   Standing    Number of Occurrences:   1    No results found for this or any previous visit (from the past 24 hour(s)). No results found.  ED Clinical Impression  1. Costochondritis      ED Assessment/Plan     previous records reviewed.  As noted in HPI.  EKG: Normal sinus rhythm, rate 74.  Normal axis, normal intervals.  No hypertrophy.  No ST-T wave changes.  No previous EKG for comparison.  Patient symptomatic while EKG was obtained.  HEART score:   History: Slightly suspicious 0 EKG: Normal 0 Age: Below 45 0 Risk factors: No known risk factors 0 Troponin: Not available Total: 0  Presentation consistent with musculoskeletal chest pain/costochondritis.  It is reproducible with arm movement and palpation.  HEARt score is 0.  Patient is PERC negative.  Will send home with Naprosyn/Tylenol, prednisone 40 mg for 5 days.  She is to restart her omeprazole and Pepcid as well.  Follow-up with PCP at [facility] primary care.  Patient requests work note.  ER return precautions given.  Discussed EKG, MDM, treatment plan, and plan for follow-up with patient. Discussed sn/sx that should prompt return to the ED. patient agrees with plan.   Meds ordered this encounter  Medications   naproxen (NAPROSYN) 500 MG tablet    Sig: Take 1 tablet (500 mg total) by mouth 2 (two) times daily.    Dispense:  20 tablet    Refill:  0   predniSONE (DELTASONE) 20 MG tablet    Sig: Take 2 tablets (40 mg total) by mouth daily with breakfast for 5 days.    Dispense:  10 tablet    Refill:  0      *This clinic  note was created using Scientist, clinical (histocompatibility and immunogenetics). Therefore, there may be occasional mistakes despite careful proofreading.  ?    Domenick Gong, MD 12/16/22 [phone]

## 2022-12-15 NOTE — ED Triage Notes (Signed)
Pt presents with intermittent left side chest pain X 1 month.

## 2022-12-15 NOTE — Discharge Instructions (Addendum)
Take the Naprosyn with 1000 mg of Tylenol twice a day.  The prednisone will help with pain and swelling.  Restart your omeprazole and Pepcid since your acid reflux has been bothering you.  Follow-up with [facility] primary care ASAP.

## 2023-01-19 ENCOUNTER — Ambulatory Visit
Admission: EM | Admit: 2023-01-19 | Discharge: 2023-01-19 | Disposition: A | Discharge status: Home / Self Care | Payer: BC Managed Care – PPO | Attending: Internal Medicine | Admitting: Internal Medicine

## 2023-01-19 DIAGNOSIS — L739 Follicular disorder, unspecified: Secondary | ICD-10-CM

## 2023-01-19 MED ORDER — DOXYCYCLINE HYCLATE 100 MG PO CAPS: 100.0000 mg | ORAL_CAPSULE | Freq: Two times a day (BID) | ORAL | 0 refills | Status: DC

## 2023-01-19 NOTE — ED Triage Notes (Signed)
Patient states she is having 2 cyst/boil on her thighs where her legs are rubbing. She states they showed up 3 weeks ago and have gotten bigger and more painful.

## 2023-01-19 NOTE — ED Provider Notes (Signed)
[name]    CSN: [id number] Arrival date & time: 01/19/23  1645      History   Chief Complaint Chief Complaint  Patient presents with   Cyst    HPI Melanie Stephenson is a 22 y.o. female.   Patient presents with abscess-like lesions present to bilateral inner thighs that have been present for couple weeks.  She reports that they sometimes have drainage.  She states that it flares up when thighs rub together.  Denies history of the same.  Denies any associated fever.     Past Medical History:  Diagnosis Date   ADD (attention deficit disorder)    Mild asthma     Patient Active Problem List   Diagnosis Date Noted   ADD (attention deficit disorder)     History reviewed. No pertinent surgical history.  OB History   No obstetric history on file.      Home Medications    Prior to Admission medications   Medication Sig Start Date End Date Taking? Authorizing Provider  albuterol (VENTOLIN HFA) 108 (90 Base) MCG/ACT inhaler Inhale 1-2 puffs into the lungs every 6 (six) hours as needed for wheezing or shortness of breath. 12/05/22  Yes Ninoska Goswick, Hildred Alamin E, FNP  doxycycline (VIBRAMYCIN) 100 MG capsule Take 1 capsule (100 mg total) by mouth 2 (two) times daily. 01/19/23  Yes Baldomero Mirarchi, Hildred Alamin E, FNP  fluticasone (FLONASE) 50 MCG/ACT nasal spray Place 1 spray into both nostrils daily. 12/05/22  Yes Remmi Armenteros, Hildred Alamin E, FNP  triamcinolone cream (KENALOG) 0.1 % Apply 1 Application topically 2 (two) times daily. 12/05/22  Yes Keishia Ground, Hildred Alamin E, FNP  cetirizine (ZYRTEC) 10 MG tablet Take by mouth.    [provider]  famotidine (PEPCID) 20 MG tablet Take 1 tablet (20 mg total) by mouth at bedtime. 06/15/22   Teodora Medici, FNP  naproxen (NAPROSYN) 500 MG tablet Take 1 tablet (500 mg total) by mouth 2 (two) times daily. 12/15/22   Melynda Ripple, MD  omeprazole (PRILOSEC) 40 MG capsule Take 1 capsule (40 mg total) by mouth 2 (two) times daily. Prilosec ( omeprazole ) 40 mg twice  daily for 4 weeks. Then reduce back to 20 mg twice daily and continue to titrate to lowest effective dose, or discontinue if symptoms resolve. 07/07/22   Cirigliano, Vito V, DO  ondansetron (ZOFRAN) 8 MG tablet Take 1 tablet (8 mg total) by mouth every 8 (eight) hours as needed for nausea or vomiting. 07/21/22   Molpus, John, MD  sucralfate (CARAFATE) 1 g tablet Take 1 tablet (1 g total) by mouth 4 (four) times daily -  with meals and at bedtime. Take omeprazole (Prilosec) at least 30 minutes before first morning dose of this medication. 07/21/22   Molpus, Jenny Reichmann, MD    Family History Family History  Problem Relation Age of Onset   Healthy Other     Social History Social History   Tobacco Use   Smoking status: Never  Substance Use Topics   Alcohol use: Not Currently   Drug use: Not Currently     Allergies   Other and Tilactase   Review of Systems Review of Systems Per HPI  Physical Exam Triage Vital Signs ED Triage Vitals  Enc Vitals Group     BP 01/19/23 1657 113/62     Pulse Rate 01/19/23 1657 78     Resp 01/19/23 1657 19     Temp 01/19/23 1657 98.2 F (36.8 C)     Temp Source  01/19/23 1657 Oral     SpO2 01/19/23 1657 96 %     Weight --      Height --      Head Circumference --      Peak Flow --      Pain Score 01/19/23 1700 8     Pain Loc --      Pain Edu? --      Excl. in [place]? --    No data found.  Updated Vital Signs BP 113/62 (BP Location: Right Arm)   Pulse 78   Temp 98.2 F (36.8 C) (Oral)   Resp 19   LMP 11/03/2022 (Approximate) Comment: irregular periods  SpO2 96%   Visual Acuity Right Eye Distance:   Left Eye Distance:   Bilateral Distance:    Right Eye Near:   Left Eye Near:    Bilateral Near:     Physical Exam Constitutional:      General: She is not in acute distress.    Appearance: Normal appearance. She is not toxic-appearing or diaphoretic.  HENT:     Head: Normocephalic and atraumatic.  Eyes:     Extraocular Movements:  Extraocular movements intact.     Conjunctiva/sclera: Conjunctivae normal.  Pulmonary:     Effort: Pulmonary effort is normal.  Skin:    Comments: Papular type lesions scattered throughout bilateral inner thighs with no drainage.  Neurological:     General: No focal deficit present.     Mental Status: She is alert and oriented to person, place, and time. Mental status is at baseline.  Psychiatric:        Mood and Affect: Mood normal.        Behavior: Behavior normal.        Thought Content: Thought content normal.        Judgment: Judgment normal.      UC Treatments / Results  Labs (all labs ordered are listed, but only abnormal results are displayed) Labs Reviewed - No data to display  EKG   Radiology No results found.  Procedures Procedures (including critical care time)  Medications Ordered in UC Medications - No data to display  Initial Impression / Assessment and Plan / UC Course  I have reviewed the triage vital signs and the nursing notes.  Pertinent labs & imaging results that were available during my care of the patient were reviewed by me and considered in my medical decision making (see chart for details).     It appears the patient may have folliculitis.  Will treat with doxycycline.  Also advised warm compresses.  Patient advised to follow-up if symptoms persist or worsen.  Patient verbalized understanding and was agreeable with plan. Final Clinical Impressions(s) / UC Diagnoses   Final diagnoses:  Folliculitis     Discharge Instructions      I have prescribed an antibiotic to treat your symptoms.  Also advise warm compresses.  Follow-up if symptoms persist or worsen.    ED Prescriptions     Medication Sig Dispense Auth. Provider   doxycycline (VIBRAMYCIN) 100 MG capsule Take 1 capsule (100 mg total) by mouth 2 (two) times daily. 20 capsule Teodora Medici, [place]      PDMP not reviewed this encounter.   Teodora Medici, [place] 01/19/23 [phone]

## 2023-01-19 NOTE — Discharge Instructions (Signed)
I have prescribed an antibiotic to treat your symptoms.  Also advise warm compresses.  Follow-up if symptoms persist or worsen.

## 2023-01-21 ENCOUNTER — Ambulatory Visit
Admission: EM | Admit: 2023-01-21 | Discharge: 2023-01-21 | Disposition: A | Discharge status: Home / Self Care | Payer: BC Managed Care – PPO

## 2023-01-21 ENCOUNTER — Telehealth: Payer: Self-pay | Admitting: Physician Assistant

## 2023-01-21 MED ORDER — ONDANSETRON 4 MG PO TBDP: 4.0000 mg | ORAL_TABLET | Freq: Three times a day (TID) | ORAL | 0 refills | Status: DC | PRN

## 2023-01-21 NOTE — Telephone Encounter (Signed)
Patient here with complaints of nausea/ vomiting with medication. Zofran prescribed to help tolerate antibiotic.

## 2023-02-04 ENCOUNTER — Ambulatory Visit
Admission: EM | Admit: 2023-02-04 | Discharge: 2023-02-04 | Disposition: A | Discharge status: Home / Self Care | Payer: BC Managed Care – PPO

## 2023-02-04 NOTE — ED Triage Notes (Signed)
Pt states 3 days ago she was accidentally hit in her right eye and she is now having pain to the eye with intermittent blurriness.

## 2023-02-11 ENCOUNTER — Ambulatory Visit
Admission: EM | Admit: 2023-02-11 | Discharge: 2023-02-11 | Disposition: A | Discharge status: Home / Self Care | Payer: BC Managed Care – PPO | Attending: Internal Medicine | Admitting: Internal Medicine

## 2023-02-11 DIAGNOSIS — J069 Acute upper respiratory infection, unspecified: Secondary | ICD-10-CM | POA: Diagnosis not present

## 2023-02-11 DIAGNOSIS — J45909 Unspecified asthma, uncomplicated: Secondary | ICD-10-CM | POA: Diagnosis not present

## 2023-02-11 DIAGNOSIS — U071 COVID-19: Secondary | ICD-10-CM | POA: Diagnosis not present

## 2023-02-11 NOTE — ED Triage Notes (Signed)
Pt c/o cough, runny nose, fever, body aches, chills, and headaches x2 days. States took therma flu today with no relief.

## 2023-02-11 NOTE — Discharge Instructions (Signed)
You have a viral illness which should run its course.  Follow-up if any symptoms persist or worsen.

## 2023-02-11 NOTE — ED Provider Notes (Signed)
[name]    CSN: [id number] Arrival date & time: 02/11/23  1557      History   Chief Complaint Chief Complaint  Patient presents with   Cough    HPI Melanie Stephenson is a 22 y.o. female.   Patient presents with cough, runny nose, fever, generalized bodyaches, chills that started 2 days ago.  Patient reports that she felt feverish but does not have documented fever.  Reports known sick contacts with similar symptoms.  She denies that she has taken any medications for symptoms.  Reports history of asthma but has not had to use albuterol inhaler since symptoms started.  Denies chest pain or shortness of breath.   Cough   Past Medical History:  Diagnosis Date   ADD (attention deficit disorder)    Mild asthma     Patient Active Problem List   Diagnosis Date Noted   ADD (attention deficit disorder)     History reviewed. No pertinent surgical history.  OB History   No obstetric history on file.      Home Medications    Prior to Admission medications   Medication Sig Start Date End Date Taking? Authorizing Provider  albuterol (VENTOLIN HFA) 108 (90 Base) MCG/ACT inhaler Inhale 1-2 puffs into the lungs every 6 (six) hours as needed for wheezing or shortness of breath. 12/05/22   Teodora Medici, FNP    Family History Family History  Problem Relation Age of Onset   Healthy Other     Social History Social History   Tobacco Use   Smoking status: Never  Substance Use Topics   Alcohol use: Not Currently   Drug use: Not Currently     Allergies   Other and Tilactase   Review of Systems Review of Systems Per HPI  Physical Exam Triage Vital Signs ED Triage Vitals  Enc Vitals Group     BP 02/11/23 1634 131/81     Pulse Rate 02/11/23 1634 91     Resp --      Temp 02/11/23 1634 98.4 F (36.9 C)     Temp src --      SpO2 02/11/23 1634 97 %     Weight --      Height --      Head Circumference --      Peak Flow --      Pain Score 02/11/23  1635 0     Pain Loc --      Pain Edu? --      Excl. in [place]? --    No data found.  Updated Vital Signs BP 131/81 (BP Location: Left Arm)   Pulse 91   Temp 98.4 F (36.9 C)   LMP 02/04/2023 (Approximate) Comment: irregular periods  SpO2 97%   Visual Acuity Right Eye Distance:   Left Eye Distance:   Bilateral Distance:    Right Eye Near:   Left Eye Near:    Bilateral Near:     Physical Exam Constitutional:      General: She is not in acute distress.    Appearance: Normal appearance. She is not toxic-appearing or diaphoretic.  HENT:     Head: Normocephalic and atraumatic.     Right Ear: Tympanic membrane and ear canal normal.     Left Ear: Tympanic membrane and ear canal normal.     Nose: Congestion present.     Mouth/Throat:     Mouth: Mucous membranes are moist.     Pharynx: No posterior  oropharyngeal erythema.  Eyes:     Extraocular Movements: Extraocular movements intact.     Conjunctiva/sclera: Conjunctivae normal.     Pupils: Pupils are equal, round, and reactive to light.  Cardiovascular:     Rate and Rhythm: Normal rate and regular rhythm.     Pulses: Normal pulses.     Heart sounds: Normal heart sounds.  Pulmonary:     Effort: Pulmonary effort is normal. No respiratory distress.     Breath sounds: Normal breath sounds. No stridor. No wheezing, rhonchi or rales.  Abdominal:     General: Abdomen is flat. Bowel sounds are normal.     Palpations: Abdomen is soft.  Musculoskeletal:        General: Normal range of motion.     Cervical back: Normal range of motion.  Skin:    General: Skin is warm and dry.  Neurological:     General: No focal deficit present.     Mental Status: She is alert and oriented to person, place, and time. Mental status is at baseline.  Psychiatric:        Mood and Affect: Mood normal.        Behavior: Behavior normal.      UC Treatments / Results  Labs (all labs ordered are listed, but only abnormal results are displayed) Labs  Reviewed  SARS CORONAVIRUS 2 (TAT 6-24 HRS)    EKG   Radiology No results found.  Procedures Procedures (including critical care time)  Medications Ordered in UC Medications - No data to display  Initial Impression / Assessment and Plan / UC Course  I have reviewed the triage vital signs and the nursing notes.  Pertinent labs & imaging results that were available during my care of the patient were reviewed by me and considered in my medical decision making (see chart for details).     Patient presents with symptoms likely from a viral upper respiratory infection.  Do not suspect underlying cardiopulmonary process. Symptoms seem unlikely related to ACS, CHF or COPD exacerbations, pneumonia, pneumothorax. Patient is nontoxic appearing and not in need of emergent medical intervention. Covid test pending.   Recommended symptom control with over the counter medications.  Return if symptoms fail to improve in 1-2 weeks or you develop shortness of breath, chest pain, severe headache. Patient states understanding and is agreeable.  Discharged with PCP followup.  Final Clinical Impressions(s) / UC Diagnoses   Final diagnoses:  Viral upper respiratory tract infection with cough     Discharge Instructions      You have a viral illness which should run its course.  Follow-up if any symptoms persist or worsen.    ED Prescriptions   None    PDMP not reviewed this encounter.   Teodora Medici,  02/11/23 [phone]

## 2023-02-12 LAB — SARS CORONAVIRUS 2 (TAT 6-24 HRS): SARS Coronavirus 2: POSITIVE — AB

## 2023-04-03 ENCOUNTER — Ambulatory Visit
Admission: EM | Admit: 2023-04-03 | Discharge: 2023-04-03 | Disposition: A | Discharge status: Home / Self Care | Payer: BC Managed Care – PPO

## 2023-04-03 DIAGNOSIS — R197 Diarrhea, unspecified: Secondary | ICD-10-CM

## 2023-04-03 NOTE — ED Triage Notes (Signed)
Pt states fever,chills ,headache and diarrhea that had blood in it for the past 2 days. Took ibuprofen for the fever.

## 2023-04-03 NOTE — Discharge Instructions (Signed)
Take a dose of imodium.  Drink plenty of fluids

## 2023-04-05 NOTE — ED Provider Notes (Signed)
[name]    CSN: [id number] Arrival date & time: 04/03/23  1339      History   Chief Complaint Chief Complaint  Patient presents with   Diarrhea    HPI Melanie Stephenson is a 22 y.o. female.   Patient complains of a fever and a headache for the past 2 days.  Patient reports that she has had several episodes of diarrhea.  Patient reports she saw a small amount of blood and most recent diarrhea.  Patient denies any fever or chills she has not had any vomiting.  Patient denies any current abdominal pain  The history is provided by the patient. No language interpreter was used.  Diarrhea Quality:  Watery Severity:  Moderate Duration:  2 days Timing:  Intermittent Progression:  Worsening   Past Medical History:  Diagnosis Date   ADD (attention deficit disorder)    Mild asthma     Patient Active Problem List   Diagnosis Date Noted   ADD (attention deficit disorder)     History reviewed. No pertinent surgical history.  OB History   No obstetric history on file.      Home Medications    Prior to Admission medications   Medication Sig Start Date End Date Taking? Authorizing Provider  albuterol (VENTOLIN HFA) 108 (90 Base) MCG/ACT inhaler Inhale 1-2 puffs into the lungs every 6 (six) hours as needed for wheezing or shortness of breath. 12/05/22   [name], FNP    Family History Family History  Problem Relation Age of Onset   Healthy Other     Social History Social History   Tobacco Use   Smoking status: Never  Substance Use Topics   Alcohol use: Not Currently   Drug use: Not Currently     Allergies   Other and Tilactase   Review of Systems Review of Systems  Gastrointestinal:  Positive for diarrhea.  All other systems reviewed and are negative.    Physical Exam Triage Vital Signs ED Triage Vitals  Enc Vitals Group     BP 04/03/23 1443 119/68     Pulse Rate 04/03/23 1443 (!) 103     Resp 04/03/23 1443 16     Temp 04/03/23  1443 99.4 F (37.4 C)     Temp Source 04/03/23 1443 Oral     SpO2 04/03/23 1443 96 %     Weight --      Height --      Head Circumference --      Peak Flow --      Pain Score 04/03/23 1445 8     Pain Loc --      Pain Edu? --      Excl. in [place]? --    No data found.  Updated Vital Signs BP 119/68 (BP Location: Left Arm)   Pulse (!) 103   Temp 99.4 F (37.4 C) (Oral)   Resp 16   LMP 03/09/2023 (Approximate)   SpO2 96%   Visual Acuity Right Eye Distance:   Left Eye Distance:   Bilateral Distance:    Right Eye Near:   Left Eye Near:    Bilateral Near:     Physical Exam Vitals and nursing note reviewed.  Constitutional:      Appearance: She is well-developed.  HENT:     Head: Normocephalic.  Cardiovascular:     Rate and Rhythm: Normal rate and regular rhythm.  Pulmonary:     Effort: Pulmonary effort is normal.  Abdominal:  General: There is no distension.  Musculoskeletal:        General: Normal range of motion.     Cervical back: Normal range of motion.  Skin:    General: Skin is warm.  Neurological:     General: No focal deficit present.     Mental Status: She is alert and oriented to person, place, and time.  Psychiatric:        Mood and Affect: Mood normal.      UC Treatments / Results  Labs (all labs ordered are listed, but only abnormal results are displayed) Labs Reviewed - No data to display  EKG   Radiology No results found.  Procedures Procedures (including critical care time)  Medications Ordered in UC Medications - No data to display  Initial Impression / Assessment and Plan / UC Course  I have reviewed the triage vital signs and the nursing notes.  Pertinent labs & imaging results that were available during my care of the patient were reviewed by me and considered in my medical decision making (see chart for details).     I suspect patient has a viral illness she is advised to try taking a dose of Imodium she is advised to  increase p.o. fluids to prevent dehydration she is to return if any problems Final Clinical Impressions(s) / UC Diagnoses   Final diagnoses:  Diarrhea, unspecified type     Discharge Instructions      Take a dose of imodium.  Drink plenty of fluids    ED Prescriptions   None    PDMP not reviewed this encounter. An After Visit Summary was printed and given to the patient.       Elson Areas, [place] 04/05/23 1042

## 2023-06-03 IMAGING — CR DG CHEST 2V
2 series · 2 of 2 positions shown · non-contrast
Comparison: None.

CLINICAL DATA: Chest tightness

EXAM:
CHEST - 2 VIEW

[chest pa]
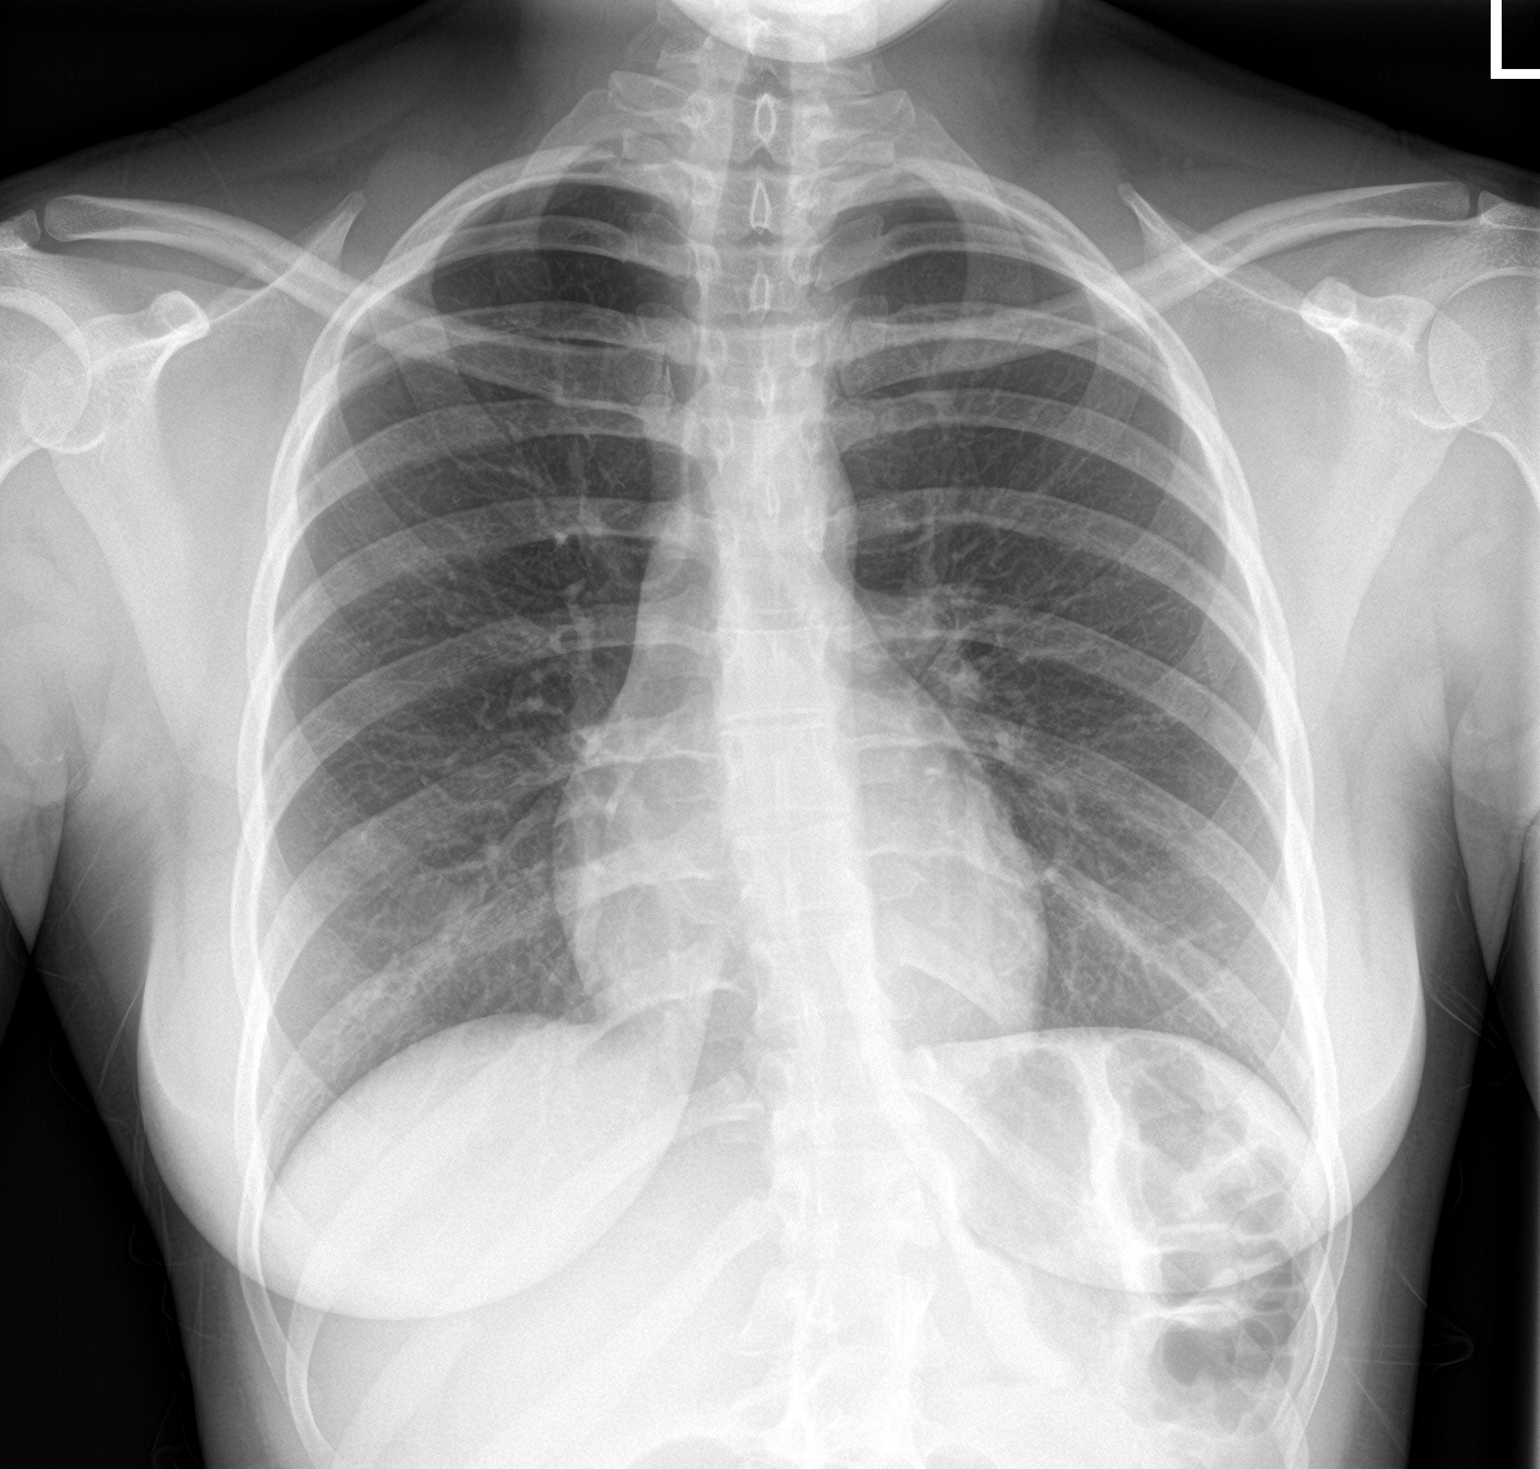

[chest lat]
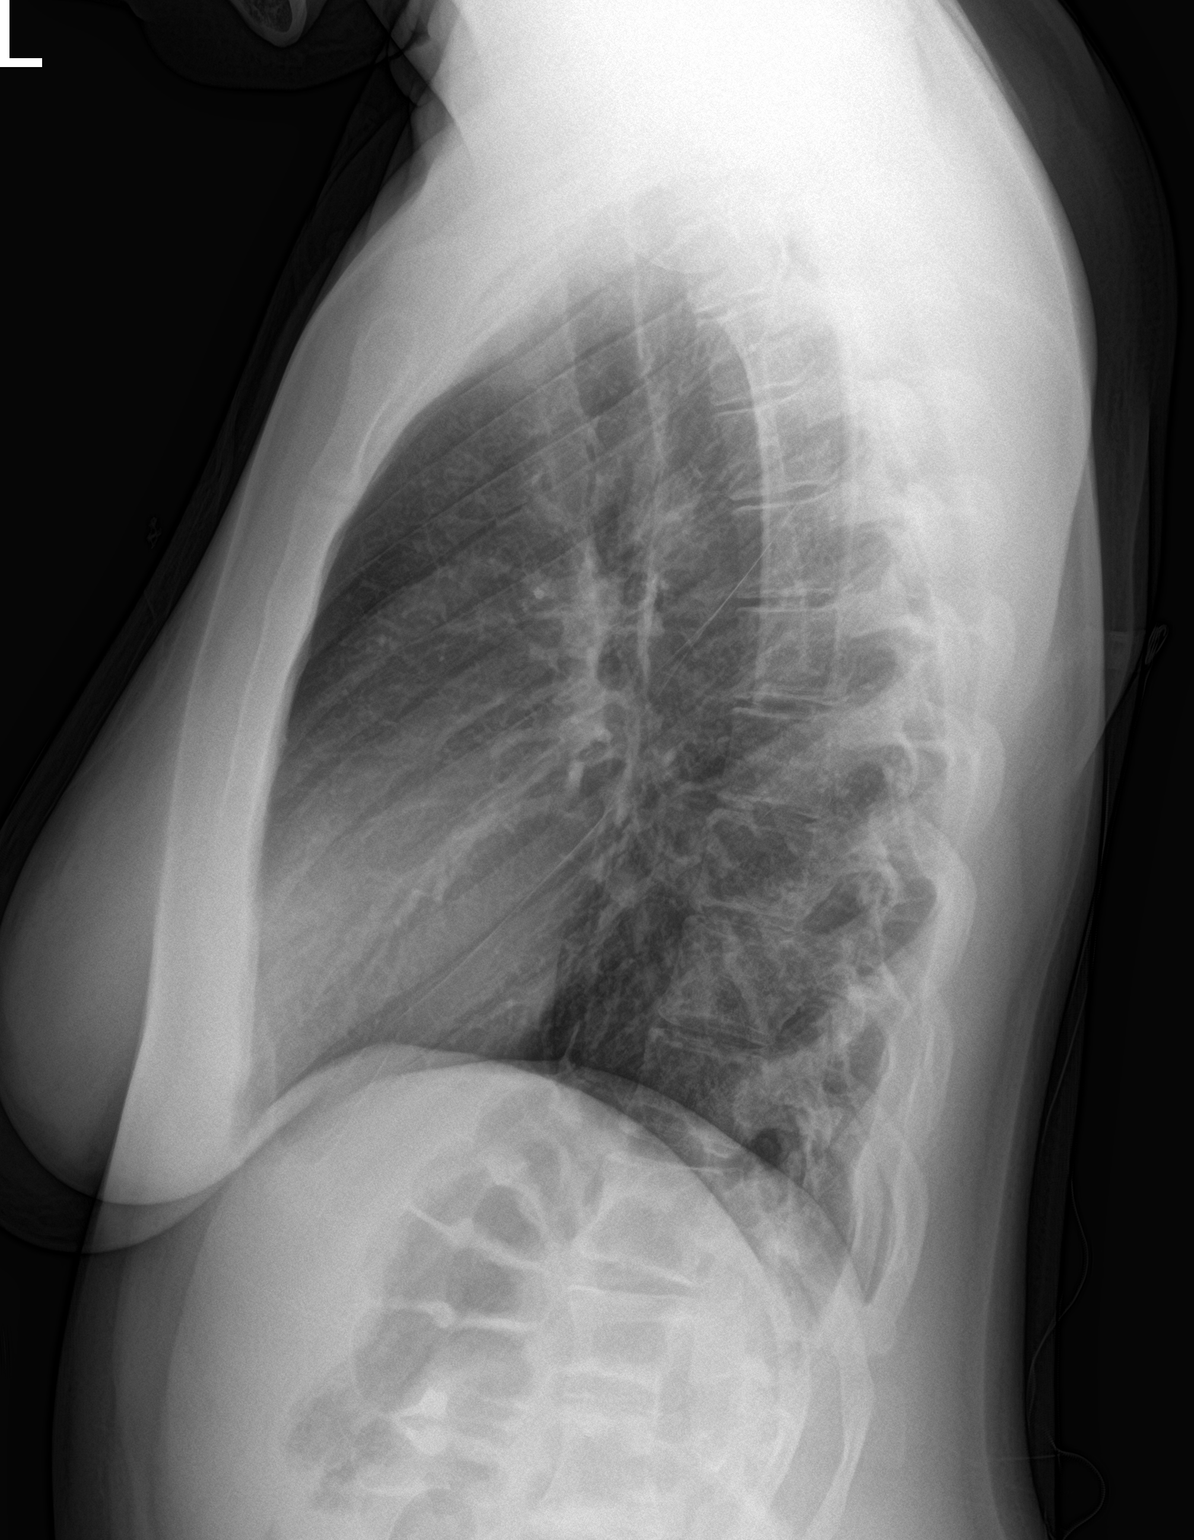

[2 of 2 positions shown; findings below may reference images not displayed]

FINDINGS: The heart size and mediastinal contours are within normal limits.
Both lungs are clear. The visualized skeletal structures are
unremarkable.
IMPRESSION: No active cardiopulmonary disease.

## 2023-06-21 ENCOUNTER — Other Ambulatory Visit: Payer: Self-pay

## 2023-06-21 ENCOUNTER — Encounter (HOSPITAL_BASED_OUTPATIENT_CLINIC_OR_DEPARTMENT_OTHER): Payer: Self-pay

## 2023-06-21 ENCOUNTER — Emergency Department (HOSPITAL_BASED_OUTPATIENT_CLINIC_OR_DEPARTMENT_OTHER): Payer: Worker's Compensation | Admitting: Radiology

## 2023-06-21 ENCOUNTER — Emergency Department (HOSPITAL_BASED_OUTPATIENT_CLINIC_OR_DEPARTMENT_OTHER)
Admission: EM | Admit: 2023-06-21 | Discharge: 2023-06-22 | Disposition: A | Discharge status: Home / Self Care | Payer: Worker's Compensation | Attending: Emergency Medicine | Admitting: Emergency Medicine

## 2023-06-21 DIAGNOSIS — X58XXXA Exposure to other specified factors, initial encounter: Secondary | ICD-10-CM | POA: Diagnosis not present

## 2023-06-21 DIAGNOSIS — S90212A Contusion of left great toe with damage to nail, initial encounter: Secondary | ICD-10-CM | POA: Diagnosis not present

## 2023-06-21 DIAGNOSIS — M79675 Pain in left toe(s): Secondary | ICD-10-CM | POA: Diagnosis present

## 2023-06-21 NOTE — Discharge Instructions (Signed)
Return if any problems.

## 2023-06-21 NOTE — ED Provider Notes (Signed)
[place] EMERGENCY DEPARTMENT AT [facility] Provider Note   CSN: [id number] Arrival date & time: 06/21/23  2113     History  Chief Complaint  Patient presents with   Toe Injury    Melanie Stephenson is a 22 y.o. female.  Pt reports that she dropped a box on her foot at work.  Pt complains of pain in her toes.  Pt has bleeding from under 1st toe nail   The history is provided by the patient. No language interpreter was used.       Home Medications Prior to Admission medications   Medication Sig Start Date End Date Taking? Authorizing Provider  albuterol (VENTOLIN HFA) 108 (90 Base) MCG/ACT inhaler Inhale 1-2 puffs into the lungs every 6 (six) hours as needed for wheezing or shortness of breath. 12/05/22   [name], FNP      Allergies    Other and Tilactase    Review of Systems   Review of Systems  All other systems reviewed and are negative.   Physical Exam Updated Vital Signs BP (!) 131/94 (BP Location: Left Arm)   Pulse 96   Temp 98.3 F (36.8 C) (Oral)   Resp 16   Ht 5\' 2"  (1.575 m)   Wt 77.1 kg   SpO2 100%   BMI 31.09 kg/m  Physical Exam Vitals and nursing note reviewed.  Constitutional:      Appearance: She is well-developed.  HENT:     Head: Normocephalic.  Pulmonary:     Effort: Pulmonary effort is normal.  Abdominal:     General: There is no distension.  Musculoskeletal:        General: Swelling and tenderness present.     Cervical back: Normal range of motion.     Comments: Tender distal toes,  dried blood under 1st toe. Nv and ns intact   Neurological:     Mental Status: She is alert and oriented to person, place, and time.     ED Results / Procedures / Treatments   Labs (all labs ordered are listed, but only abnormal results are displayed) Labs Reviewed - No data to display  EKG None  Radiology DG Foot Complete Left  Result Date: 06/21/2023 CLINICAL DATA:  Left toe injury.  Dropped heavy box on left toes. EXAM: LEFT FOOT  - COMPLETE 3+ VIEW COMPARISON:  None Available. FINDINGS: There is no evidence of fracture or dislocation. There is no evidence of arthropathy or other focal bone abnormality. Soft tissues are unremarkable. IMPRESSION: Negative. Electronically Signed   By: Thornell Sartorius M.D.   On: 06/21/2023 22:09    Procedures Procedures    Medications Ordered in ED Medications - No data to display  ED Course/ Medical Decision Making/ A&P                             Medical Decision Making Pt complains of pain in her toes after dropping a box on her foot.   Amount and/or Complexity of Data Reviewed Radiology: ordered and independent interpretation performed. Decision-making details documented in ED Course.    Details: Xray left foot,  no fracture   Risk OTC drugs. Risk Details: Pt counseled on nail bed injuries            Final Clinical Impression(s) / ED Diagnoses Final diagnoses:  Contusion of left great toe with damage to nail, initial encounter    Rx / DC Orders ED  Discharge Orders     None      An After Visit Summary was printed and given to the patient.    Elson Areas, PA-C 06/21/23 2352    Geoffery Lyons, MD 06/22/23 [phone]

## 2023-06-21 NOTE — ED Triage Notes (Signed)
Patient here POV from Home.  Endorses dropping a heavy box onto her Left Toes an hour ago.   NAD Noted during Triage. A&Ox4. GCS 15. BIB Wheelchair.

## 2023-06-28 ENCOUNTER — Emergency Department (HOSPITAL_BASED_OUTPATIENT_CLINIC_OR_DEPARTMENT_OTHER)
Admission: EM | Admit: 2023-06-28 | Discharge: 2023-06-28 | Disposition: A | Discharge status: Home / Self Care | Payer: BC Managed Care – PPO | Attending: Emergency Medicine | Admitting: Emergency Medicine

## 2023-06-28 ENCOUNTER — Other Ambulatory Visit (HOSPITAL_BASED_OUTPATIENT_CLINIC_OR_DEPARTMENT_OTHER): Payer: Self-pay

## 2023-06-28 ENCOUNTER — Other Ambulatory Visit: Payer: Self-pay

## 2023-06-28 DIAGNOSIS — M79675 Pain in left toe(s): Secondary | ICD-10-CM | POA: Diagnosis not present

## 2023-06-28 DIAGNOSIS — J45909 Unspecified asthma, uncomplicated: Secondary | ICD-10-CM | POA: Diagnosis not present

## 2023-06-28 MED ORDER — SULFAMETHOXAZOLE-TRIMETHOPRIM 800-160 MG PO TABS
1.0000 | ORAL_TABLET | Freq: Two times a day (BID) | ORAL | 0 refills | Status: AC
  Filled 2023-06-28: qty 14, 7d supply, fill #0

## 2023-06-28 MED ORDER — MUPIROCIN 2 % EX OINT
1.0000 | TOPICAL_OINTMENT | Freq: Two times a day (BID) | CUTANEOUS | 0 refills | Status: AC
  Filled 2023-06-28: qty 22, 7d supply, fill #0

## 2023-06-28 MED ORDER — BUPIVACAINE HCL (PF) 0.5 % IJ SOLN
10.0000 mL | Freq: Once | INTRAMUSCULAR | Status: AC
  Administered 2023-06-28: 10 mL
  Filled 2023-06-28: qty 10

## 2023-06-28 MED ORDER — IBUPROFEN 600 MG PO TABS
600.0000 mg | ORAL_TABLET | Freq: Four times a day (QID) | ORAL | 0 refills | Status: AC | PRN
  Filled 2023-06-28: qty 30, 8d supply, fill #0

## 2023-06-28 NOTE — ED Provider Notes (Signed)
[place] EMERGENCY DEPARTMENT AT [facility] Provider Note   CSN: [id number] Arrival date & time: 06/28/23  1122     History  Chief Complaint  Patient presents with   Toe Pain    Melanie Stephenson is a 22 y.o. female.   Toe Pain   23 year old female presents emergency department with complaints of left great toe pain.  Patient states that she initially had injury 1 week ago when she dropped a box on her left great toe.  Was seen in the emergency department at that time with negative foot x-ray at that time.  Patient states that she removed her acrylic nail and noticed some bruising of blood from the base of her nail as well as at the tip of her nail.  Approximately 2 to 3 days ago, noticed some cloudy white/yellow appearing drainage from the base of her nail as well as some redness of her toe development.  Presented the emergency department due to continued progression and concern for secondary infection.  Denies any fever, weakness/sensory deficits in affected foot or additional trauma/injury.  Past medical history significant for MDD, asthma  Home Medications Prior to Admission medications   Medication Sig Start Date End Date Taking? Authorizing Provider  ibuprofen (ADVIL) 600 MG tablet Take 1 tablet (600 mg total) by mouth every 6 (six) hours as needed. 06/28/23  Yes Sherian Maroon A, PA  mupirocin ointment (BACTROBAN) 2 % Apply 1 Application topically 2 (two) times daily for 7 days. 06/28/23 07/05/23 Yes Sherian Maroon A, PA  sulfamethoxazole-trimethoprim (BACTRIM DS) 800-160 MG tablet Take 1 tablet by mouth 2 (two) times daily for 7 days. 06/28/23 07/05/23 Yes Sherian Maroon A, PA  albuterol (VENTOLIN HFA) 108 (90 Base) MCG/ACT inhaler Inhale 1-2 puffs into the lungs every 6 (six) hours as needed for wheezing or shortness of breath. 12/05/22   [name], FNP      Allergies    Other and Tilactase    Review of Systems   Review of Systems  All other systems reviewed and are  negative.   Physical Exam Updated Vital Signs BP 120/80 (BP Location: Right Arm)   Pulse 90   Temp 98.5 F (36.9 C) (Oral)   Resp 16   SpO2 99%  Physical Exam Vitals and nursing note reviewed.  Constitutional:      General: She is not in acute distress.    Appearance: She is well-developed.  HENT:     Head: Normocephalic and atraumatic.  Eyes:     Conjunctiva/sclera: Conjunctivae normal.  Cardiovascular:     Rate and Rhythm: Normal rate and regular rhythm.     Heart sounds: No murmur heard. Pulmonary:     Effort: Pulmonary effort is normal. No respiratory distress.     Breath sounds: Normal breath sounds.  Abdominal:     Palpations: Abdomen is soft.     Tenderness: There is no abdominal tenderness.  Musculoskeletal:        General: No swelling.     Cervical back: Neck supple.     Comments: Patient with slight swelling noted on the dorsal aspect of patient's left great toe with erythema appreciated on the medial and lateral aspects of nail.  No obvious paronychia or palpable fluctuance laterally.  Patient with some purulent drainage appreciated on the base of the nail.  Full range of motion of affected digit.  Skin:    General: Skin is warm and dry.     Capillary Refill: Capillary refill takes  less than 2 seconds.  Neurological:     Mental Status: She is alert.  Psychiatric:        Mood and Affect: Mood normal.     ED Results / Procedures / Treatments   Labs (all labs ordered are listed, but only abnormal results are displayed) Labs Reviewed - No data to display  EKG None  Radiology No results found.  Procedures Excise ingrown toenail  Date/Time: 06/28/2023 2:39 PM  Performed by: Peter Garter, PA Authorized by: Peter Garter, PA  Consent: Verbal consent obtained. Consent given by: patient Patient understanding: patient states understanding of the procedure being performed Patient consent: the patient's understanding of the procedure matches consent  given Patient identity confirmed: verbally with patient and arm band Time out: Immediately prior to procedure a "time out" was called to verify the correct patient, procedure, equipment, support staff and site/side marked as required. Preparation: Patient was prepped and draped in the usual sterile fashion. Local anesthesia used: yes Anesthesia: digital block  Anesthesia: Local anesthesia used: yes Local Anesthetic: bupivacaine 0.5% without epinephrine Anesthetic total: 5 mL  Sedation: Patient sedated: no  Patient tolerance: patient tolerated the procedure well with no immediate complications Comments: Patient was anesthetized using digital block of Marcaine and base of great toe of left foot.  Marcaine was injected intermittently with continuing with rolling of plunge on syringe to avoid injecting in vasculature.  Patient's nail was removed using blunt dissection with splinting of nailbed thereafter.  Hemostasis achieved postprocedural.  Patient placed with antibiotic ointment as well as bulky nonadherent dressing.  Patient tolerated procedure well without acute immediate complications.        Medications Ordered in ED Medications  bupivacaine(PF) (MARCAINE) 0.5 % injection 10 mL (10 mLs Infiltration Given 06/28/23 1211)    ED Course/ Medical Decision Making/ A&P                             Medical Decision Making Risk Prescription drug management.   This patient presents to the ED for concern of toe pain, this involves an extensive number of treatment options, and is a complaint that carries with it a high risk of complications and morbidity.  The differential diagnosis includes fracture, dislocation, strain/pain, ligamentous/tendinous injury, abscess, cellulitis, erysipelas   Co morbidities that complicate the patient evaluation  See HPI   Additional history obtained:  Additional history obtained from EMR External records from outside source obtained and reviewed  including hospital   Lab Tests:  N/a   Imaging Studies ordered:  N/a   Cardiac Monitoring: / EKG:  The patient was maintained on a cardiac monitor.  I personally viewed and interpreted the cardiac monitored which showed an underlying rhythm of: Sinus rhythm   Consultations Obtained:  N/a   Problem List / ED Course / Critical interventions / Medication management  Left great toe pain I ordered medication including morphine   Reevaluation of the patient after these medicines showed that the patient improved I have reviewed the patients home medicines and have made adjustments as needed   Social Determinants of Health:  Denies tobacco, licit drug use   Test / Admission - Considered:  Left great toe pain Vitals signs  within normal range and stable throughout visit. 22 year old female presents emergency department with complaints of continued left great toe pain as well as with appreciable discharge.  Patient found with evidence of concern for cellulitic skin changes of left  toe along with possible abscess at the base of nailbed.  Nail was removed in manner inspected above with appreciable mild purulent drainage at the base of nailbed.  Some cellulitic skin changes along the lateral aspects of the nail.  Will place patient on oral antibiotics and recommend follow-up with primary care for further evaluation/reassessment of patient's symptoms.  Strict return precautions were discussed at length.  Treatment plan discussed at length with patient and she acknowledged understanding was agreeable to said plan. Worrisome signs and symptoms were discussed with the patient, and the patient acknowledged understanding to return to the ED if noticed. Patient was stable upon discharge.          Final Clinical Impression(s) / ED Diagnoses Final diagnoses:  Pain of toe of left foot    Rx / DC Orders ED Discharge Orders          Ordered    sulfamethoxazole-trimethoprim (BACTRIM  DS) 800-160 MG tablet  2 times daily        06/28/23 1319    ibuprofen (ADVIL) 600 MG tablet  Every 6 hours PRN        06/28/23 1319    mupirocin ointment (BACTROBAN) 2 %  2 times daily        06/28/23 1319              Peter Garter, [place] 06/28/23 1442    Maia Plan, MD 06/30/23 [phone]

## 2023-06-28 NOTE — ED Triage Notes (Signed)
Pt arrives to ED with c/o left great toe pain x1 week. Was seen earlier in the week but is back due to concern of infection.

## 2023-06-28 NOTE — Discharge Instructions (Addendum)
As discussed, there is evidence on exam for infection of your big toe most likely secondary to cut or laceration you suffered from dropping a box on your toe.  We will send you home with antibiotic ointment to place over where your nail was removed as well as oral antibiotics to treat infection of the toe.  Recommend treatment of pain at home with Tylenol/Motrin as well as follow-up with primary care for reassessment of symptoms.  Please do not hesitate to return to emergency department for worrisome signs and symptoms we discussed become apparent.

## 2023-08-05 ENCOUNTER — Ambulatory Visit
Admission: EM | Admit: 2023-08-05 | Discharge: 2023-08-05 | Disposition: A | Discharge status: Home / Self Care | Payer: BC Managed Care – PPO

## 2023-08-05 DIAGNOSIS — R110 Nausea: Secondary | ICD-10-CM | POA: Diagnosis not present

## 2023-08-05 DIAGNOSIS — R197 Diarrhea, unspecified: Secondary | ICD-10-CM

## 2023-08-05 LAB — POCT URINALYSIS DIP (MANUAL ENTRY)
Bilirubin, UA: NEGATIVE
Blood, UA: NEGATIVE
Glucose, UA: NEGATIVE mg/dL
Ketones, POC UA: NEGATIVE mg/dL
Nitrite, UA: NEGATIVE
Protein Ur, POC: NEGATIVE mg/dL
Spec Grav, UA: 1.025 (ref 1.010–1.025)
Urobilinogen, UA: 0.2 E.U./dL
pH, UA: 6.5 (ref 5.0–8.0)

## 2023-08-05 MED ORDER — ONDANSETRON 4 MG PO TBDP: 4.0000 mg | ORAL_TABLET | Freq: Three times a day (TID) | ORAL | 0 refills | Status: AC | PRN

## 2023-08-05 NOTE — ED Provider Notes (Signed)
[name]    CSN: [id number] Arrival date & time: 08/05/23  1650      History   Chief Complaint Chief Complaint  Patient presents with   Abdominal Pain    With loose stools.    HPI Melanie Stephenson is a 22 y.o. female.   Patient presents to urgent care for evaluation of nausea without vomiting, diarrhea, and generalized abdominal pain that started 2 days ago. Reports approximately 5-6 episodes of diarrhea per day with urgency to defecate. She has not had any episodes of emesis, not currently nauseous. No recent changes in diet, dizziness, sick contacts, urinary symptoms, headaches, or fever/chills. No blood/mucous to the stools, water from an unknown source, or recent tick bites. No family history of bowel problems. LMP 08/02/2023, no chance of pregnancy.  No history of IBS. She has not attempted treatment of symptoms PTA.    Abdominal Pain   Past Medical History:  Diagnosis Date   ADD (attention deficit disorder)    Mild asthma     Patient Active Problem List   Diagnosis Date Noted   ADD (attention deficit disorder)     History reviewed. No pertinent surgical history.  OB History   No obstetric history on file.      Home Medications    Prior to Admission medications   Medication Sig Start Date End Date Taking? Authorizing Provider  Ferrous Sulfate (IRON PO) Take by mouth.   Yes [provider]  ondansetron (ZOFRAN-ODT) 4 MG disintegrating tablet Take 1 tablet (4 mg total) by mouth every 8 (eight) hours as needed for nausea or vomiting. 08/05/23  Yes Carlisle Beers, FNP  albuterol (VENTOLIN HFA) 108 (90 Base) MCG/ACT inhaler Inhale 1-2 puffs into the lungs every 6 (six) hours as needed for wheezing or shortness of breath. 12/05/22   [name], FNP  ibuprofen (ADVIL) 600 MG tablet Take 1 tablet (600 mg total) by mouth every 6 (six) hours as needed. 06/28/23   Peter Garter, PA    Family History Family History  Problem Relation Age  of Onset   Healthy Other     Social History Social History   Tobacco Use   Smoking status: Never   Smokeless tobacco: Never  Vaping Use   Vaping status: Never Used  Substance Use Topics   Alcohol use: Not Currently   Drug use: Never     Allergies   Other   Review of Systems Review of Systems  Gastrointestinal:  Positive for abdominal pain.  Per HPI  Physical Exam Triage Vital Signs ED Triage Vitals  Encounter Vitals Group     BP 08/05/23 1706 105/71     Systolic BP Percentile --      Diastolic BP Percentile --      Pulse Rate 08/05/23 1706 87     Resp 08/05/23 1706 18     Temp 08/05/23 1706 98 F (36.7 C)     Temp Source 08/05/23 1706 Oral     SpO2 08/05/23 1706 97 %     Weight 08/05/23 1703 160 lb (72.6 kg)     Height 08/05/23 1703 5\' 2"  (1.575 m)     Head Circumference --      Peak Flow --      Pain Score 08/05/23 1700 1     Pain Loc --      Pain Education --      Exclude from Growth Chart --    No data found.  Updated  Vital Signs BP 105/71 (BP Location: Left Arm)   Pulse 87   Temp 98 F (36.7 C) (Oral)   Resp 18   Ht 5\' 2"  (1.575 m)   Wt 160 lb (72.6 kg)   LMP 08/02/2023 (Exact Date)   SpO2 97%   BMI 29.26 kg/m   Visual Acuity Right Eye Distance:   Left Eye Distance:   Bilateral Distance:    Right Eye Near:   Left Eye Near:    Bilateral Near:     Physical Exam Vitals and nursing note reviewed.  Constitutional:      Appearance: She is not ill-appearing or toxic-appearing.  HENT:     Head: Normocephalic and atraumatic.     Right Ear: Hearing and external ear normal.     Left Ear: Hearing and external ear normal.     Nose: Nose normal.     Mouth/Throat:     Lips: Pink.  Eyes:     General: Lids are normal. Vision grossly intact. Gaze aligned appropriately.     Extraocular Movements: Extraocular movements intact.     Conjunctiva/sclera: Conjunctivae normal.  Pulmonary:     Effort: Pulmonary effort is normal.  Musculoskeletal:      Cervical back: Neck supple.  Skin:    General: Skin is warm and dry.     Capillary Refill: Capillary refill takes less than 2 seconds.     Findings: No rash.  Neurological:     General: No focal deficit present.     Mental Status: She is alert and oriented to person, place, and time. Mental status is at baseline.     Cranial Nerves: No dysarthria or facial asymmetry.  Psychiatric:        Mood and Affect: Mood normal.        Speech: Speech normal.        Behavior: Behavior normal.        Thought Content: Thought content normal.        Judgment: Judgment normal.      UC Treatments / Results  Labs (all labs ordered are listed, but only abnormal results are displayed) Labs Reviewed  POCT URINALYSIS DIP (MANUAL ENTRY) - Abnormal; Notable for the following components:      Result Value   Leukocytes, UA Trace (*)    All other components within normal limits    EKG   Radiology No results found.  Procedures Procedures (including critical care time)  Medications Ordered in UC Medications - No data to display  Initial Impression / Assessment and Plan / UC Course  I have reviewed the triage vital signs and the nursing notes.  Pertinent labs & imaging results that were available during my care of the patient were reviewed by me and considered in my medical decision making (see chart for details).   1. Nausea without vomiting, diarrhea Suspect symptoms are viral in nature.  Patient appears to be well-hydrated to physical exam.  Tolerating food and fluids well without vomiting.  Mild tenderness to abdominal exam without peritoneal signs.  May use Imodium as needed over-the-counter. Will defer stool culture at this time as symptoms have only been present for 2 days. Nursing staff ordered urinalysis, urine is unremarkable for signs of UTI. May follow-up with PCP or return to urgent care if symptoms fail to improve in the next 24 to 48 hours.  Counseled patient on potential for  adverse effects with medications prescribed/recommended today, strict ER and return-to-clinic precautions discussed, patient verbalized understanding.  Final Clinical Impressions(s) / UC Diagnoses   Final diagnoses:  Nausea without vomiting  Diarrhea, unspecified type   Discharge Instructions   None    ED Prescriptions     Medication Sig Dispense Auth. Provider   ondansetron (ZOFRAN-ODT) 4 MG disintegrating tablet Take 1 tablet (4 mg total) by mouth every 8 (eight) hours as needed for nausea or vomiting. 20 tablet Carlisle Beers, FNP      PDMP not reviewed this encounter.   Carlisle Beers, [place] 08/05/23 1839

## 2023-08-05 NOTE — ED Triage Notes (Signed)
"  I am having stomach pains, a lot of diarrhea, started about 2 days ago". No fever. Some previous nausea with 1 vomiting episode.

## 2023-08-05 NOTE — Discharge Instructions (Addendum)
Diarrhea is likely viral. Eat a bland diet (white toast, applesauce, bananas, etc.). Drink plenty of water.  You may also find that drinking Gatorade light or Pedialyte may help with your symptoms.  You may take Zofran every 8 hours as needed for nausea and vomiting.  If you develop any new or worsening symptoms or if your symptoms do not start to improve, please return here or follow-up with your primary care provider. If your symptoms are severe, please go to the emergency room.

## 2023-09-19 ENCOUNTER — Emergency Department (HOSPITAL_BASED_OUTPATIENT_CLINIC_OR_DEPARTMENT_OTHER)
Admission: EM | Admit: 2023-09-19 | Discharge: 2023-09-19 | Disposition: A | Discharge status: Home / Self Care | Payer: BC Managed Care – PPO | Attending: Emergency Medicine | Admitting: Emergency Medicine

## 2023-09-19 ENCOUNTER — Other Ambulatory Visit: Payer: Self-pay

## 2023-09-19 ENCOUNTER — Emergency Department (HOSPITAL_BASED_OUTPATIENT_CLINIC_OR_DEPARTMENT_OTHER): Payer: BC Managed Care – PPO | Admitting: Radiology

## 2023-09-19 DIAGNOSIS — J45909 Unspecified asthma, uncomplicated: Secondary | ICD-10-CM | POA: Insufficient documentation

## 2023-09-19 DIAGNOSIS — R0789 Other chest pain: Secondary | ICD-10-CM | POA: Diagnosis not present

## 2023-09-19 DIAGNOSIS — R079 Chest pain, unspecified: Secondary | ICD-10-CM | POA: Diagnosis present

## 2023-09-19 LAB — BASIC METABOLIC PANEL
Anion gap: 10 (ref 5–15)
BUN: 17 mg/dL (ref 6–20)
CO2: 26 mmol/L (ref 22–32)
Calcium: 9.6 mg/dL (ref 8.9–10.3)
Chloride: 102 mmol/L (ref 98–111)
Creatinine, Ser: 0.77 mg/dL (ref 0.44–1.00)
GFR, Estimated: >60 mL/min (ref >60)
Glucose, Bld: 106 mg/dL — ABNORMAL HIGH (ref 70–99)
Potassium: 3.6 mmol/L (ref 3.5–5.1)
Sodium: 138 mmol/L (ref 135–145)

## 2023-09-19 LAB — CBC
HCT: 37.1 % (ref 36.0–46.0)
Hemoglobin: 12.2 g/dL (ref 12.0–15.0)
MCH: 29.3 pg (ref 26.0–34.0)
MCHC: 32.9 g/dL (ref 30.0–36.0)
MCV: 89 fL (ref 80.0–100.0)
Platelets: 306 10*3/uL (ref 150–400)
RBC: 4.17 MIL/uL (ref 3.87–5.11)
RDW: 14.2 % (ref 11.5–15.5)
WBC: 7.6 10*3/uL (ref 4.0–10.5)
nRBC: 0 % (ref 0.0–0.2)

## 2023-09-19 LAB — TROPONIN I (HIGH SENSITIVITY): Troponin I (High Sensitivity): 3 ng/L (ref <18)

## 2023-09-19 MED ORDER — KETOROLAC TROMETHAMINE 30 MG/ML IJ SOLN
30.0000 mg | Freq: Once | INTRAMUSCULAR | Status: AC
  Administered 2023-09-19: 30 mg via INTRAMUSCULAR
  Filled 2023-09-19: qty 1

## 2023-09-19 NOTE — ED Provider Notes (Signed)
[place] EMERGENCY DEPARTMENT AT [facility] Provider Note   CSN: [id number] Arrival date & time: 09/19/23  0036     History  Chief Complaint  Patient presents with   Chest Pain    Melanie Stephenson is a 22 y.o. female.  HPI    This is a 22 year old female who presents with chest pain.  Patient reports sharp 7 out of 10 chest pain that is nonradiating.  Initial onset of symptoms several hours prior to arrival.  However she states she intermittently has these pains.  Sometimes they are worse with movement but tonight she does not note anything that makes them better or worse.  She has not had any recent upper respiratory symptoms including fevers, chills.  Denies cough or shortness of breath.  Denies lower extremity swelling or history of blood clots.  No estrogen products.  Home Medications Prior to Admission medications   Medication Sig Start Date End Date Taking? Authorizing Provider  albuterol (VENTOLIN HFA) 108 (90 Base) MCG/ACT inhaler Inhale 1-2 puffs into the lungs every 6 (six) hours as needed for wheezing or shortness of breath. 12/05/22   [name], FNP  Ferrous Sulfate (IRON PO) Take by mouth.    [provider]  ibuprofen (ADVIL) 600 MG tablet Take 1 tablet (600 mg total) by mouth every 6 (six) hours as needed. 06/28/23   Peter Garter, PA  ondansetron (ZOFRAN-ODT) 4 MG disintegrating tablet Take 1 tablet (4 mg total) by mouth every 8 (eight) hours as needed for nausea or vomiting. 08/05/23   Carlisle Beers, FNP      Allergies    Other    Review of Systems   Review of Systems  Constitutional:  Negative for fever.  Respiratory:  Negative for shortness of breath.   Cardiovascular:  Positive for chest pain. Negative for leg swelling.  All other systems reviewed and are negative.   Physical Exam Updated Vital Signs BP 131/89   Pulse 95   Temp 98 F (36.7 C) (Oral)   Resp 18   Wt 63.5 kg   LMP 02/22/2023 (Approximate)   SpO2 98%    BMI 25.61 kg/m  Physical Exam Vitals and nursing note reviewed.  Constitutional:      Appearance: She is well-developed. She is not ill-appearing.  HENT:     Head: Normocephalic and atraumatic.  Eyes:     Pupils: Pupils are equal, round, and reactive to light.  Cardiovascular:     Rate and Rhythm: Normal rate and regular rhythm.     Heart sounds: Normal heart sounds.  Pulmonary:     Effort: Pulmonary effort is normal. No respiratory distress.     Breath sounds: No wheezing.  Abdominal:     General: Bowel sounds are normal.     Palpations: Abdomen is soft.  Musculoskeletal:     Cervical back: Neck supple.  Skin:    General: Skin is warm and dry.  Neurological:     General: No focal deficit present.     Mental Status: She is alert and oriented to person, place, and time.     ED Results / Procedures / Treatments   Labs (all labs ordered are listed, but only abnormal results are displayed) Labs Reviewed  BASIC METABOLIC PANEL - Abnormal; Notable for the following components:      Result Value   Glucose, Bld 106 (*)    All other components within normal limits  CBC  PREGNANCY, URINE  TROPONIN I (HIGH  SENSITIVITY)    EKG EKG Interpretation Date/Time:  Monday September 19 2023 01:10:23 EDT Ventricular Rate:  86 PR Interval:  156 QRS Duration:  84 QT Interval:  380 QTC Calculation: 454 R Axis:   65  Text Interpretation: Normal sinus rhythm Nonspecific ST and T wave abnormality Abnormal ECG When compared with ECG of 15-Dec-2022 13:53, No significant change was found Confirmed by Ross Marcus ([id number]) on 09/19/2023 3:29:21 AM  Radiology DG Chest 2 View  Result Date: 09/19/2023 CLINICAL DATA:  Chest pain for 3 hours. EXAM: CHEST - 2 VIEW COMPARISON:  11/17/2021 FINDINGS: The heart size and mediastinal contours are within normal limits. Both lungs are clear. The visualized skeletal structures are unremarkable. IMPRESSION: No active cardiopulmonary disease.  Electronically Signed   By: Burman Nieves M.D.   On: 09/19/2023 02:34    Procedures Procedures    Medications Ordered in ED Medications  ketorolac (TORADOL) 30 MG/ML injection 30 mg (30 mg Intramuscular Given 09/19/23 0349)    ED Course/ Medical Decision Making/ A&P                                 Medical Decision Making Amount and/or Complexity of Data Reviewed Labs: ordered. Radiology: ordered.  Risk Prescription drug management.   This patient presents to the ED for concern of chest pain, this involves an extensive number of treatment options, and is a complaint that carries with it a high risk of complications and morbidity.  I considered the following differential and admission for this acute, potentially life threatening condition.  The differential diagnosis includes ACS, PE, pneumothorax, pneumonia, musculoskeletal etiology  MDM:    This is a 22 year old female who presents with chest pain.  She is overall nontoxic and vital signs are reassuring.  Pain is fairly atypical.  EKG shows no evidence of acute arrhythmia or ischemia.  Troponin negative.  She is PE RC negative and doubt pulmonary embolism.  Chest x-ray shows no evidence of pneumothorax or pneumonia.  Patient was given a dose of Toradol.  Overall we will treat for musculoskeletal etiology.  Recommend ibuprofen.  (Labs, imaging, consults)  Labs: I Ordered, and personally interpreted labs.  The pertinent results include: CBC, BMP, troponin  Imaging Studies ordered: I ordered imaging studies including chest x-ray I independently visualized and interpreted imaging. I agree with the radiologist interpretation  Additional history obtained from chart review.  External records from outside source obtained and reviewed including prior evaluations  Cardiac Monitoring: The patient was maintained on a cardiac monitor.  If on the cardiac monitor, I personally viewed and interpreted the cardiac monitored which showed an  underlying rhythm of: Sinus rhythm  Reevaluation: After the interventions noted above, I reevaluated the patient and found that they have :stayed the same  Social Determinants of Health:  lives independently  Disposition: Discharge  Co morbidities that complicate the patient evaluation  Past Medical History:  Diagnosis Date   ADD (attention deficit disorder)    Mild asthma      Medicines Meds ordered this encounter  Medications   ketorolac (TORADOL) 30 MG/ML injection 30 mg    I have reviewed the patients home medicines and have made adjustments as needed  Problem List / ED Course: Problem List Items Addressed This Visit   None Visit Diagnoses     Atypical chest pain    -  Primary  Final Clinical Impression(s) / ED Diagnoses Final diagnoses:  Atypical chest pain    Rx / DC Orders ED Discharge Orders     None         Jelisha Weed, Mayer Masker, MD 09/19/23 [phone]

## 2023-09-19 NOTE — ED Triage Notes (Signed)
Pt self ambulated to triage c/o CP 7/10 sharp in nature x 3 hours. PT denies injury, fever chills, SOB. PT a/o x 4 VSS NAD PT on room air.

## 2023-09-19 NOTE — Discharge Instructions (Signed)
You were seen today for chest pain.  Your workup today is reassuring.  Follow-up closely with your primary doctor.  Try ibuprofen for ongoing discomfort.

## 2023-09-30 ENCOUNTER — Ambulatory Visit
Admission: EM | Admit: 2023-09-30 | Discharge: 2023-09-30 | Disposition: A | Discharge status: Home / Self Care | Payer: BC Managed Care – PPO

## 2023-09-30 DIAGNOSIS — J329 Chronic sinusitis, unspecified: Secondary | ICD-10-CM | POA: Diagnosis not present

## 2023-09-30 DIAGNOSIS — J45901 Unspecified asthma with (acute) exacerbation: Secondary | ICD-10-CM | POA: Diagnosis not present

## 2023-09-30 MED ORDER — PREDNISONE 20 MG PO TABS: 40.0000 mg | ORAL_TABLET | Freq: Every day | ORAL | 0 refills | Status: AC

## 2023-09-30 MED ORDER — CETIRIZINE HCL 10 MG PO TABS: 10.0000 mg | ORAL_TABLET | Freq: Every day | ORAL | 0 refills | Status: AC

## 2023-09-30 MED ORDER — ALBUTEROL SULFATE HFA 108 (90 BASE) MCG/ACT IN AERS: 2.0000 | INHALATION_SPRAY | RESPIRATORY_TRACT | 0 refills | Status: AC | PRN

## 2023-09-30 NOTE — ED Triage Notes (Signed)
Primary Care set up with patient.

## 2023-09-30 NOTE — ED Provider Notes (Signed)
[name]    CSN: [id number] Arrival date & time: 09/30/23  1015      History   Chief Complaint Chief Complaint  Patient presents with   Asthma Exacerbation    HPI Melanie Stephenson is a 22 y.o. female.  Patient here with 4 days of cough, chest congestion, chest tightness, mild shortness of breath with no fever. She also endorses that she has had some runny nose.  She occasionally takes allergy medicine but has not taken any medication recently.  Patient suffers from mild symptoms of asthma.  Denies any known sick contacts.  Has not tried any over-the-counter medication and also endorses that she is out of her albuterol inhaler.   Patient Active Problem List   Diagnosis Date Noted   Scoliosis, congenital 01/23/2016   Scoliosis 12/05/2014   ADD (attention deficit disorder)     History reviewed. No pertinent surgical history.  OB History   No obstetric history on file.      Home Medications    Prior to Admission medications   Medication Sig Start Date End Date Taking? Authorizing Provider  albuterol (VENTOLIN HFA) 108 (90 Base) MCG/ACT inhaler Inhale 2 puffs into the lungs every 4 (four) hours as needed for wheezing or shortness of breath. 09/30/23  Yes Bing Neighbors, NP  cetirizine (ZYRTEC) 10 MG tablet Take 1 tablet (10 mg total) by mouth daily. 09/30/23  Yes Bing Neighbors, NP  Ferrous Sulfate (IRON PO) Take by mouth.   Yes [provider]  lisdexamfetamine (VYVANSE) 40 MG capsule Take 40 mg by mouth every morning. 01/02/19  Yes [provider]  predniSONE (DELTASONE) 20 MG tablet Take 2 tablets (40 mg total) by mouth daily with breakfast. 09/30/23  Yes Bing Neighbors, NP  ibuprofen (ADVIL) 600 MG tablet Take 1 tablet (600 mg total) by mouth every 6 (six) hours as needed. 06/28/23   Peter Garter, PA  ondansetron (ZOFRAN-ODT) 4 MG disintegrating tablet Take 1 tablet (4 mg total) by mouth every 8 (eight) hours as needed for nausea  or vomiting. 08/05/23   Carlisle Beers, FNP    Family History Family History  Problem Relation Age of Onset   Healthy Other     Social History Social History   Tobacco Use   Smoking status: Never   Smokeless tobacco: Never  Vaping Use   Vaping status: Never Used  Substance Use Topics   Alcohol use: Not Currently   Drug use: Never     Allergies   Other   Review of Systems Review of Systems Pertinent negatives listed in HPI   Physical Exam Triage Vital Signs ED Triage Vitals  Encounter Vitals Group     BP 09/30/23 1023 123/83     Systolic BP Percentile --      Diastolic BP Percentile --      Pulse Rate 09/30/23 1023 98     Resp 09/30/23 1023 20     Temp 09/30/23 1023 97.7 F (36.5 C)     Temp Source 09/30/23 1023 Oral     SpO2 09/30/23 1023 99 %     Weight 09/30/23 1021 160 lb (72.6 kg)     Height 09/30/23 1021 5\' 2"  (1.575 m)     Head Circumference --      Peak Flow --      Pain Score 09/30/23 1021 0     Pain Loc --      Pain Education --  Exclude from Growth Chart --    No data found.  Updated Vital Signs BP 123/83 (BP Location: Left Arm)   Pulse 98   Temp 97.7 F (36.5 C) (Oral)   Resp 20   Ht 5\' 2"  (1.575 m)   Wt 160 lb (72.6 kg)   LMP 03/14/2023 (Approximate)   SpO2 99%   BMI 29.26 kg/m   Visual Acuity Right Eye Distance:   Left Eye Distance:   Bilateral Distance:    Right Eye Near:   Left Eye Near:    Bilateral Near:     Physical Exam Vitals reviewed.  Constitutional:      Appearance: Normal appearance.  HENT:     Head: Normocephalic and atraumatic.     Right Ear: External ear normal.     Left Ear: External ear normal.     Nose: Congestion and rhinorrhea present.     Mouth/Throat:     Pharynx: No oropharyngeal exudate or posterior oropharyngeal erythema.  Eyes:     Extraocular Movements: Extraocular movements intact.     Conjunctiva/sclera: Conjunctivae normal.     Pupils: Pupils are equal, round, and reactive to  light.  Cardiovascular:     Rate and Rhythm: Normal rate and regular rhythm.  Pulmonary:     Effort: Pulmonary effort is normal.     Breath sounds: Rhonchi present. No wheezing.  Musculoskeletal:     Cervical back: Normal range of motion and neck supple.  Lymphadenopathy:     Cervical: No cervical adenopathy.  Skin:    General: Skin is warm and dry.  Neurological:     General: No focal deficit present.     Mental Status: She is alert.      UC Treatments / Results  Labs (all labs ordered are listed, but only abnormal results are displayed) Labs Reviewed - No data to display  EKG   Radiology No results found.  Procedures Procedures (including critical care time)  Medications Ordered in UC Medications - No data to display  Initial Impression / Assessment and Plan / UC Course  I have reviewed the triage vital signs and the nursing notes.  Pertinent labs & imaging results that were available during my care of the patient were reviewed by me and considered in my medical decision making (see chart for details).   Acute asthma exacerbation we will treat with prednisone 40 mg once daily for 5 days to reduce inflammation which is causing bronchial inflammation. Also refilled albuterol inhaler may use 2 puffs every 4 hours as needed for wheezing, chest tightness, or coughing. Start cetrizine 10 mg daily for nasal symptoms until symptoms resolve or improve. Return precautions given.  Final Clinical Impressions(s) / UC Diagnoses   Final diagnoses:  Asthma with acute exacerbation, unspecified asthma severity, unspecified whether persistent  Rhinosinusitis   Discharge Instructions   None    ED Prescriptions     Medication Sig Dispense Auth. Provider   predniSONE (DELTASONE) 20 MG tablet Take 2 tablets (40 mg total) by mouth daily with breakfast. 10 tablet Bing Neighbors, NP   albuterol (VENTOLIN HFA) 108 (90 Base) MCG/ACT inhaler Inhale 2 puffs into the lungs every 4  (four) hours as needed for wheezing or shortness of breath. 1 each Bing Neighbors, NP   cetirizine (ZYRTEC) 10 MG tablet Take 1 tablet (10 mg total) by mouth daily. 30 tablet Bing Neighbors, NP      PDMP not reviewed this encounter.   Bing Neighbors,  NP 09/30/23 1113

## 2023-09-30 NOTE — ED Triage Notes (Signed)
Here for "Cough" starting Sunday. "Hard time breathing at night due to this". No runny nose. No fever (known). History of Asthma.

## 2023-12-05 IMAGING — US US ABDOMEN LIMITED
1 series · 14 of 25 positions shown · non-contrast
Comparison: None Available.

CLINICAL DATA: Right upper quadrant abdominal pain.

EXAM:
ULTRASOUND ABDOMEN LIMITED RIGHT UPPER QUADRANT

[Series 1: us abdomen limited ruq (liver/gb) · 14 of 58 slices shown]
[im 1/58]
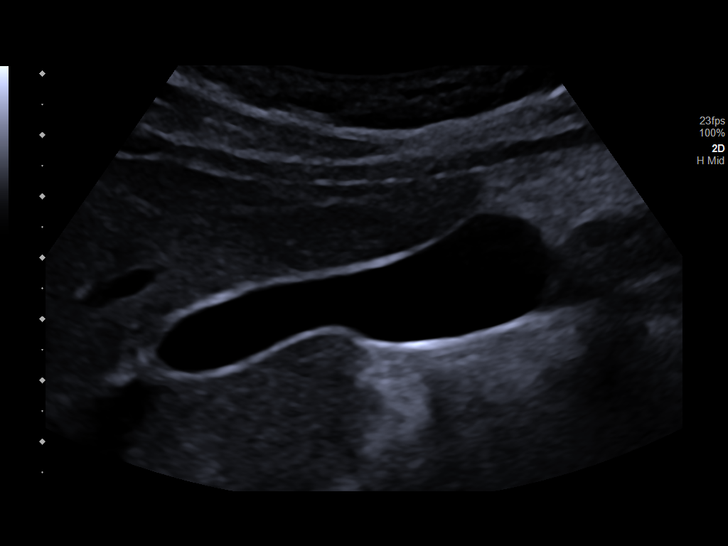
[im 5/58]
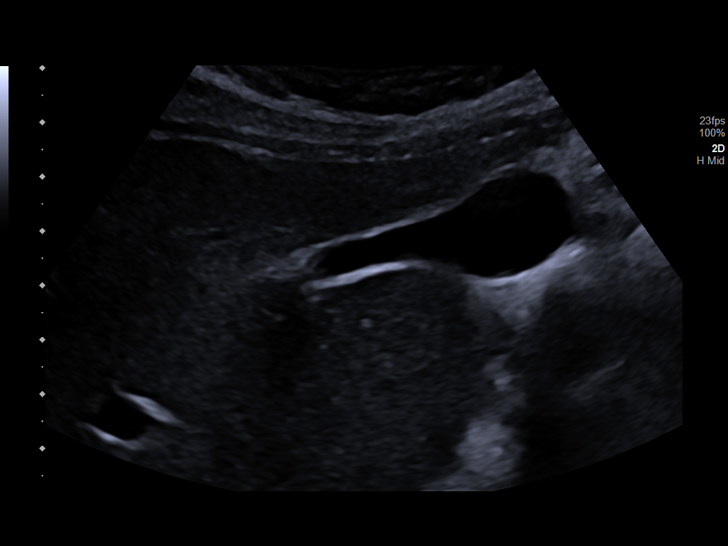
[im 10/58]
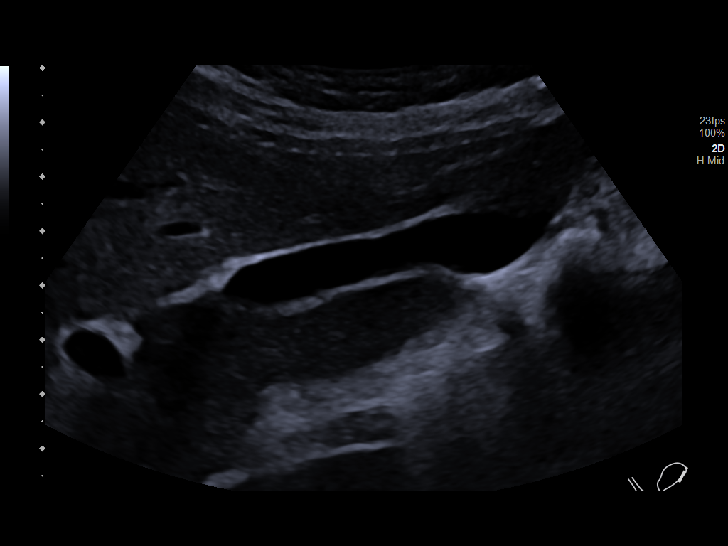
[im 15/58]
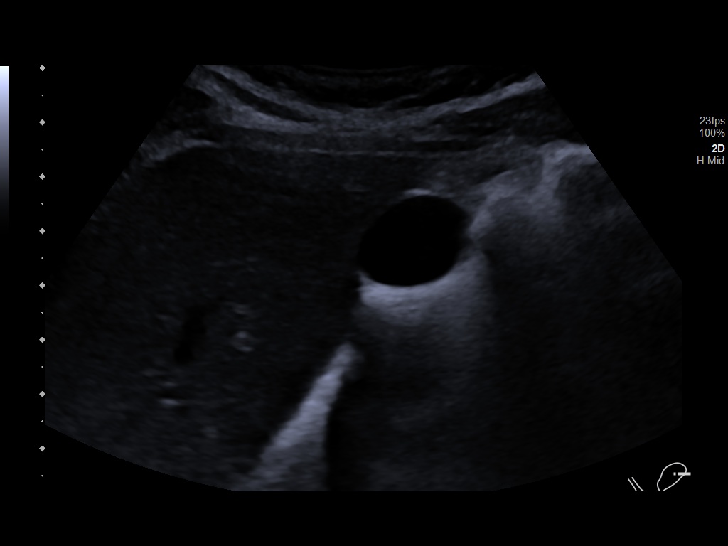
[im 20/58]
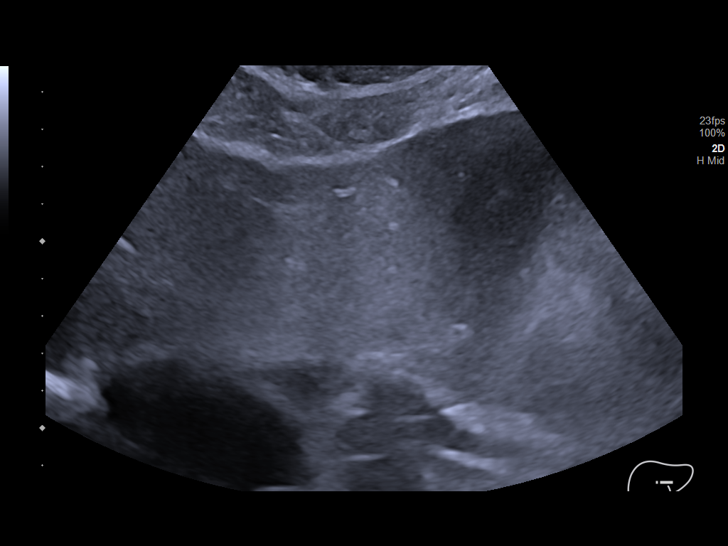
[im 22/58]
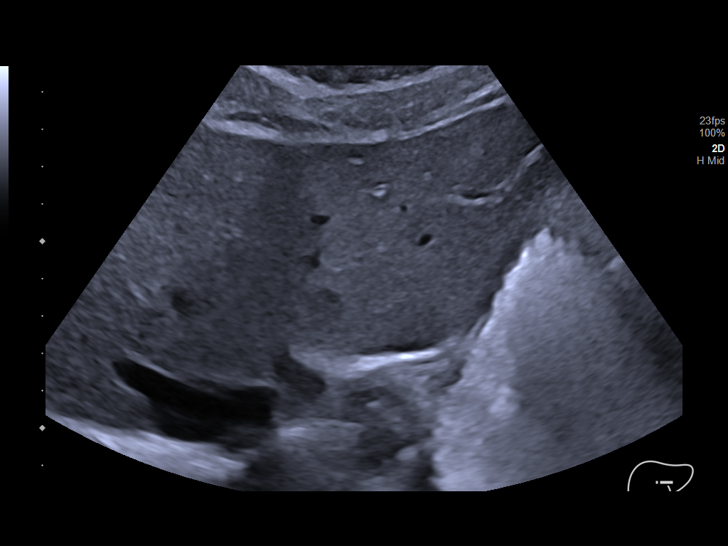
[im 27/58]
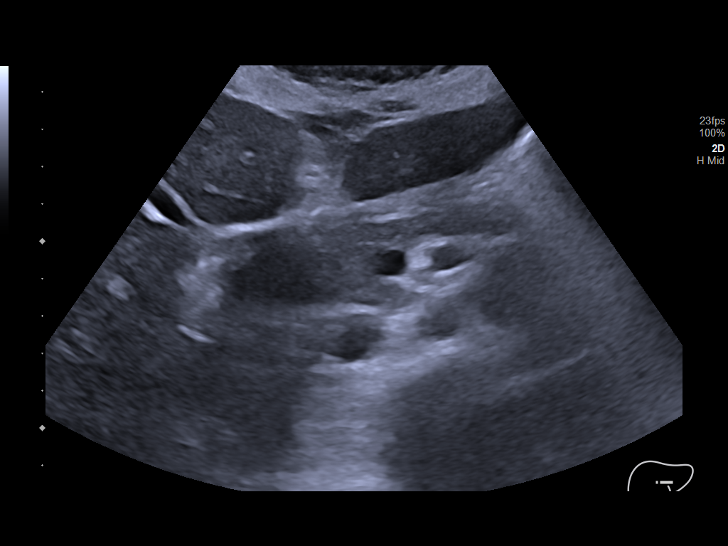
[im 31/58]
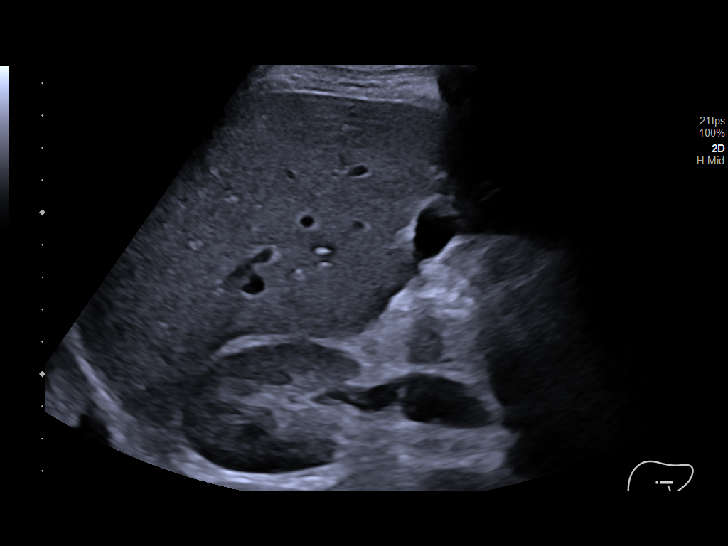
[im 36/58]
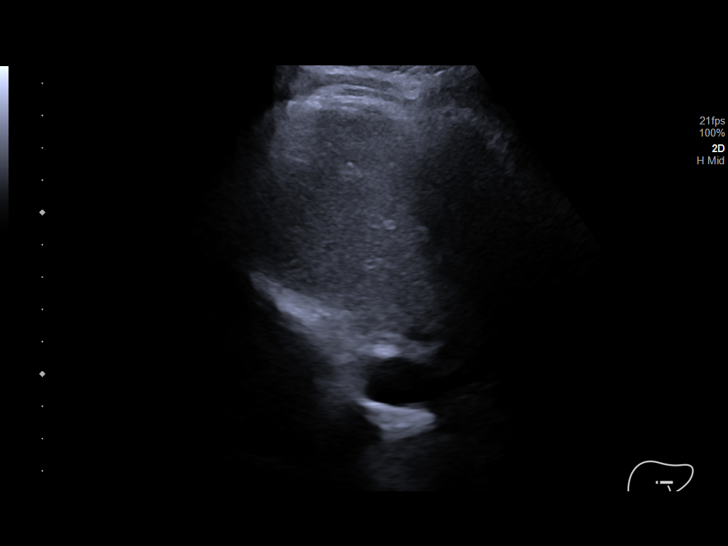
[im 39/58]
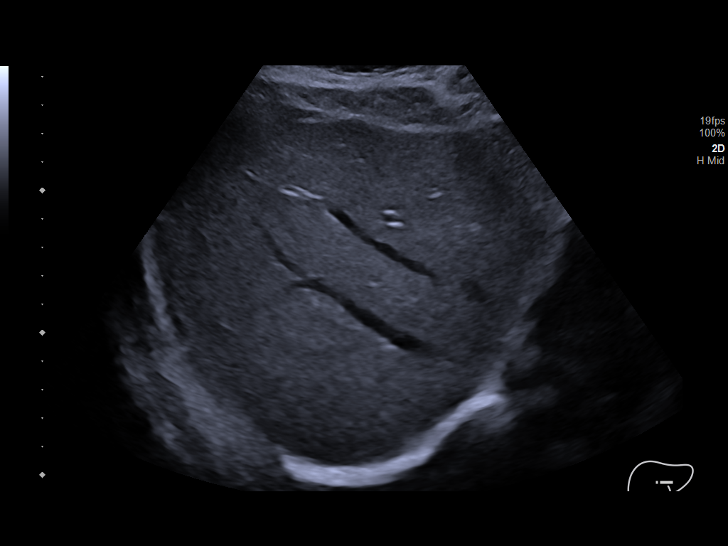
[im 43/58]
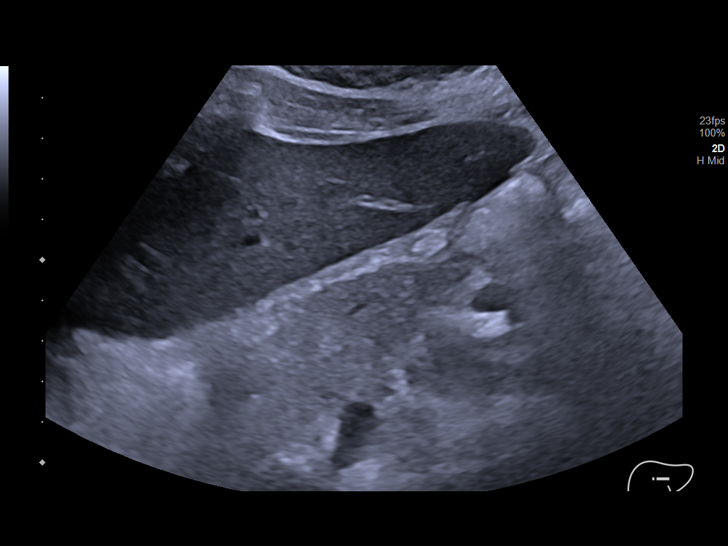
[im 48/58]
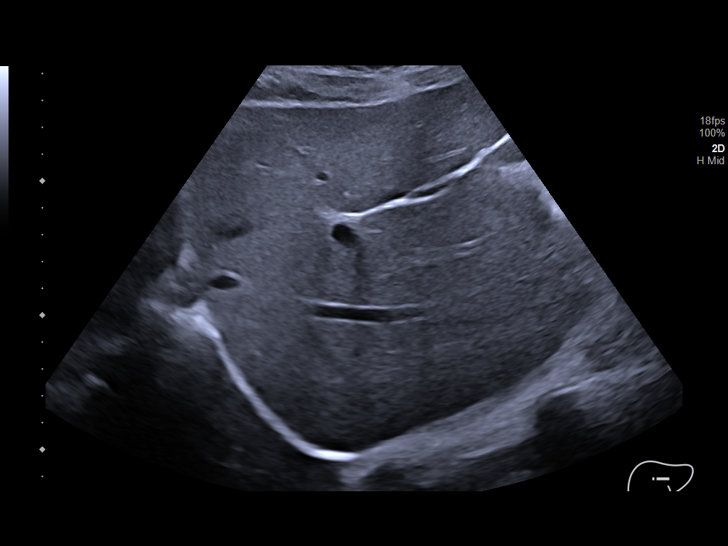
[im 53/58]
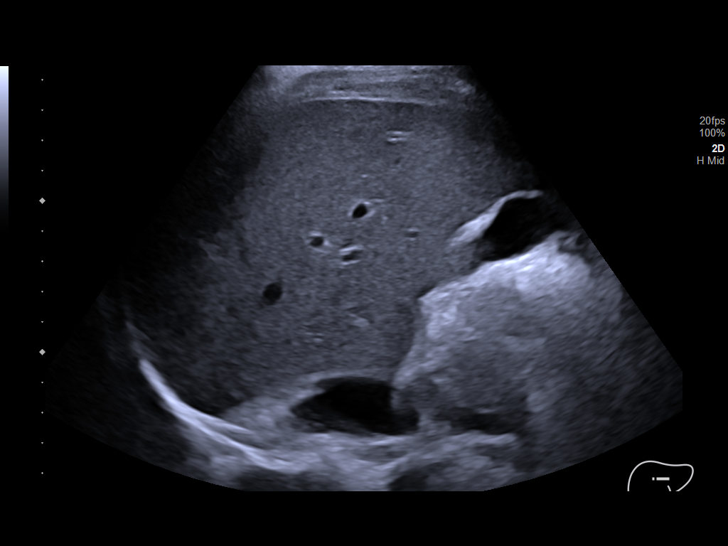
[im 58/58]
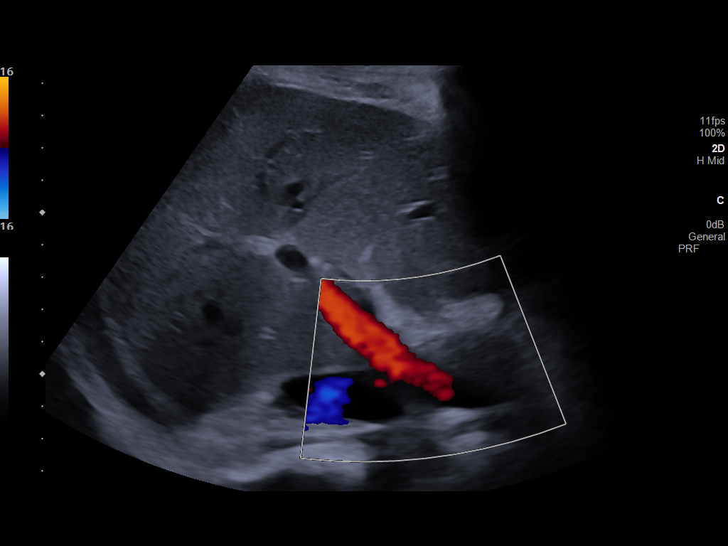

[14 of 25 positions shown; findings below may reference images not displayed]

FINDINGS: Gallbladder:

No gallstones or wall thickening visualized. No sonographic Murphy
sign noted by sonographer.

Common bile duct:

Diameter: Normal, 2 mm.

Liver:

No focal lesion identified. Within normal limits in parenchymal
echogenicity. Portal vein is patent on color Doppler imaging with
normal direction of blood flow towards the liver.

Other: None.
IMPRESSION: Normal right upper quadrant ultrasound.

## 2023-12-09 ENCOUNTER — Ambulatory Visit: Payer: BC Managed Care – PPO | Admitting: Family

## 2024-03-18 ENCOUNTER — Other Ambulatory Visit: Payer: Self-pay

## 2024-03-18 ENCOUNTER — Emergency Department (HOSPITAL_COMMUNITY)
Admission: EM | Admit: 2024-03-18 | Discharge: 2024-03-18 | Disposition: A | Discharge status: Home / Self Care | Attending: Emergency Medicine | Admitting: Emergency Medicine

## 2024-03-18 ENCOUNTER — Emergency Department (HOSPITAL_COMMUNITY)

## 2024-03-18 DIAGNOSIS — R0789 Other chest pain: Secondary | ICD-10-CM | POA: Insufficient documentation

## 2024-03-18 DIAGNOSIS — R0602 Shortness of breath: Secondary | ICD-10-CM | POA: Insufficient documentation

## 2024-03-18 DIAGNOSIS — R079 Chest pain, unspecified: Secondary | ICD-10-CM | POA: Diagnosis present

## 2024-03-18 MED ORDER — IBUPROFEN 800 MG PO TABS
800.0000 mg | ORAL_TABLET | Freq: Once | ORAL | Status: AC
  Administered 2024-03-18: 800 mg via ORAL
  Filled 2024-03-18: qty 1

## 2024-03-18 MED ORDER — IBUPROFEN 600 MG PO TABS: 600.0000 mg | ORAL_TABLET | Freq: Four times a day (QID) | ORAL | 0 refills | Status: AC | PRN

## 2024-03-18 MED ORDER — METHOCARBAMOL 500 MG PO TABS: 500.0000 mg | ORAL_TABLET | Freq: Two times a day (BID) | ORAL | 0 refills | Status: AC

## 2024-03-18 NOTE — ED Triage Notes (Signed)
 Pt states she woke up with chest tightness and pain this AM. Pt went to work, but chest pain increased and SHOB, also had a brief episode of tingling in the left arm.

## 2024-03-18 NOTE — ED Provider Notes (Signed)
 [place] EMERGENCY DEPARTMENT AT [facility] Provider Note   CSN: [id number] Arrival date & time: 03/18/24  1343     History  Chief Complaint  Patient presents with   Chest Pain   Shortness of Breath    Melanie Stephenson is a 23 y.o. female.  23 year old female presents with left-sided chest discomfort has been constant nature.  Pain is characterized as sharp and worse with any movement of her arm or if she presses on her chest.  She has no associated dizziness or diaphoresis.  No prior history of coronary disease.  Denies any history of trauma.  No rashes noted to her chest.  Pain better with remaining still with no treatment use prior to arrival       Home Medications Prior to Admission medications   Medication Sig Start Date End Date Taking? Authorizing Provider  albuterol (VENTOLIN HFA) 108 (90 Base) MCG/ACT inhaler Inhale 2 puffs into the lungs every 4 (four) hours as needed for wheezing or shortness of breath. 09/30/23   Bing Neighbors, NP  cetirizine (ZYRTEC) 10 MG tablet Take 1 tablet (10 mg total) by mouth daily. 09/30/23   Bing Neighbors, NP  Ferrous Sulfate (IRON PO) Take by mouth.    [provider]  ibuprofen (ADVIL) 600 MG tablet Take 1 tablet (600 mg total) by mouth every 6 (six) hours as needed. 06/28/23   Peter Garter, PA  lisdexamfetamine (VYVANSE) 40 MG capsule Take 40 mg by mouth every morning. 01/02/19   [provider]  ondansetron (ZOFRAN-ODT) 4 MG disintegrating tablet Take 1 tablet (4 mg total) by mouth every 8 (eight) hours as needed for nausea or vomiting. 08/05/23   Carlisle Beers, FNP  predniSONE (DELTASONE) 20 MG tablet Take 2 tablets (40 mg total) by mouth daily with breakfast. 09/30/23   Bing Neighbors, NP      Allergies    Other    Review of Systems   Review of Systems  All other systems reviewed and are negative.   Physical Exam Updated Vital Signs BP (!) 160/107 (BP Location: Left Arm)   Pulse  (!) 103   Temp 98.2 F (36.8 C) (Oral)   Resp 18   Ht 1.575 m (5\' 2" )   Wt 72.6 kg   LMP 03/04/2024   SpO2 97%   BMI 29.26 kg/m  Physical Exam Vitals and nursing note reviewed.  Constitutional:      General: She is not in acute distress.    Appearance: Normal appearance. She is well-developed. She is not toxic-appearing.  HENT:     Head: Normocephalic and atraumatic.  Eyes:     General: Lids are normal.     Conjunctiva/sclera: Conjunctivae normal.     Pupils: Pupils are equal, round, and reactive to light.  Neck:     Thyroid: No thyroid mass.     Trachea: No tracheal deviation.  Cardiovascular:     Rate and Rhythm: Normal rate and regular rhythm.     Heart sounds: Normal heart sounds. No murmur heard.    No gallop.  Pulmonary:     Effort: Pulmonary effort is normal. No respiratory distress.     Breath sounds: Normal breath sounds. No stridor. No decreased breath sounds, wheezing, rhonchi or rales.  Chest:     Chest wall: Tenderness present. No swelling.    Abdominal:     General: There is no distension.     Palpations: Abdomen is soft.  Tenderness: There is no abdominal tenderness. There is no rebound.  Musculoskeletal:        General: No tenderness. Normal range of motion.     Cervical back: Normal range of motion and neck supple.  Skin:    General: Skin is warm and dry.     Findings: No abrasion or rash.  Neurological:     Mental Status: She is alert and oriented to person, place, and time. Mental status is at baseline.     GCS: GCS eye subscore is 4. GCS verbal subscore is 5. GCS motor subscore is 6.     Cranial Nerves: No cranial nerve deficit.     Sensory: No sensory deficit.     Motor: Motor function is intact.  Psychiatric:        Attention and Perception: Attention normal.        Speech: Speech normal.        Behavior: Behavior normal.     ED Results / Procedures / Treatments   Labs (all labs ordered are listed, but only abnormal results are  displayed) Labs Reviewed - No data to display  EKG EKG Interpretation Date/Time:  Sunday March 18 2024 14:20:13 EDT Ventricular Rate:  82 PR Interval:  152 QRS Duration:  80 QT Interval:  380 QTC Calculation: 444 R Axis:   71  Text Interpretation: Sinus rhythm Consider right atrial enlargement Borderline T wave abnormalities No significant change since last tracing Confirmed by Lorre Nick ([id number]) on 03/18/2024 2:22:25 PM  Radiology DG Chest 2 View Result Date: 03/18/2024 CLINICAL DATA:  Chest pain. EXAM: CHEST - 2 VIEW COMPARISON:  September 19, 2023. FINDINGS: The heart size and mediastinal contours are within normal limits. Both lungs are clear. The visualized skeletal structures are unremarkable. IMPRESSION: No active cardiopulmonary disease. Electronically Signed   By: Lupita Raider M.D.   On: 03/18/2024 14:10    Procedures Procedures    Medications Ordered in ED Medications  ibuprofen (ADVIL) tablet 800 mg (has no administration in time range)    ED Course/ Medical Decision Making/ A&P                                 Medical Decision Making Amount and/or Complexity of Data Reviewed Radiology: ordered.  Risk Prescription drug management.   Chest x-ray per interpretation shows no acute findings.  EKG shows normal sinus rhythm.  Unchanged from prior.  Patient with reproducible left anterior chest wall pain.  Low suspicion for ACS or PE.  Given ibuprofen and will discharge home        Final Clinical Impression(s) / ED Diagnoses Final diagnoses:  None    Rx / DC Orders ED Discharge Orders     None         Lorre Nick, MD 03/18/24 1423

## 2024-06-04 ENCOUNTER — Ambulatory Visit: Admission: EM | Admit: 2024-06-04 | Discharge: 2024-06-04 | Disposition: A | Discharge status: Home / Self Care

## 2024-06-04 DIAGNOSIS — M79604 Pain in right leg: Secondary | ICD-10-CM

## 2024-06-04 NOTE — ED Provider Notes (Signed)
 [name]    CSN: [id number] Arrival date & time: 06/04/24  1537      History   Chief Complaint No chief complaint on file.   HPI Melanie Stephenson is a 23 y.o. female.   Patient here today for evaluation of right leg pain that started a few days ago.  She reports that she has not any injury to the area.  She notes that bearing weight worsens pain and she is having some trouble with walking due to same.  She does not report any numbness or tingling.    The history is provided by the patient.    Past Medical History:  Diagnosis Date   ADD (attention deficit disorder)    Mild asthma     Patient Active Problem List   Diagnosis Date Noted   Scoliosis, congenital 01/23/2016   Scoliosis 12/05/2014   ADD (attention deficit disorder)     History reviewed. No pertinent surgical history.  OB History   No obstetric history on file.      Home Medications    Prior to Admission medications   Medication Sig Start Date End Date Taking? Authorizing Provider  albuterol  (VENTOLIN  HFA) 108 (90 Base) MCG/ACT inhaler Inhale 2 puffs into the lungs every 4 (four) hours as needed for wheezing or shortness of breath. 09/30/23   Buena Carmine, NP  cetirizine  (ZYRTEC ) 10 MG tablet Take 1 tablet (10 mg total) by mouth daily. 09/30/23   Buena Carmine, NP  Ferrous Sulfate (IRON PO) Take by mouth.    [provider]  ibuprofen  (ADVIL ) 600 MG tablet Take 1 tablet (600 mg total) by mouth every 6 (six) hours as needed. 06/28/23   [place] Butter, PA  ibuprofen  (ADVIL ) 600 MG tablet Take 1 tablet (600 mg total) by mouth every 6 (six) hours as needed. 03/18/24   Lind Repine, MD  lisdexamfetamine (VYVANSE) 40 MG capsule Take 40 mg by mouth every morning. 01/02/19   [provider]  methocarbamol  (ROBAXIN ) 500 MG tablet Take 1 tablet (500 mg total) by mouth 2 (two) times daily. 03/18/24   Lind Repine, MD  ondansetron  (ZOFRAN -ODT) 4 MG disintegrating tablet Take 1  tablet (4 mg total) by mouth every 8 (eight) hours as needed for nausea or vomiting. 08/05/23   Starlene Eaton, FNP  predniSONE  (DELTASONE ) 20 MG tablet Take 2 tablets (40 mg total) by mouth daily with breakfast. 09/30/23   Buena Carmine, NP    Family History Family History  Problem Relation Age of Onset   Healthy Other     Social History Social History   Tobacco Use   Smoking status: Never   Smokeless tobacco: Never  Vaping Use   Vaping status: Never Used  Substance Use Topics   Alcohol use: Not Currently   Drug use: Never     Allergies   Other   Review of Systems Review of Systems  Constitutional:  Negative for chills and fever.  Eyes:  Negative for discharge and redness.  Respiratory:  Negative for shortness of breath.   Gastrointestinal:  Negative for abdominal pain, nausea and vomiting.  Musculoskeletal:  Positive for myalgias.     Physical Exam Triage Vital Signs ED Triage Vitals [06/04/24 1543]  Encounter Vitals Group     BP 129/79     Girls Systolic BP Percentile      Girls Diastolic BP Percentile      Boys Systolic BP Percentile      Boys Diastolic  BP Percentile      Pulse Rate 75     Resp 18     Temp 98.6 F (37 C)     Temp Source Oral     SpO2 98 %     Weight      Height      Head Circumference      Peak Flow      Pain Score 7     Pain Loc      Pain Education      Exclude from Growth Chart    No data found.  Updated Vital Signs BP 129/79 (BP Location: Right Arm)   Pulse 75   Temp 98.6 F (37 C) (Oral)   Resp 18   SpO2 98%   Visual Acuity Right Eye Distance:   Left Eye Distance:   Bilateral Distance:    Right Eye Near:   Left Eye Near:    Bilateral Near:     Physical Exam Vitals and nursing note reviewed.  Constitutional:      General: She is not in acute distress.    Appearance: Normal appearance. She is not ill-appearing.  HENT:     Head: Normocephalic and atraumatic.   Eyes:     Conjunctiva/sclera:  Conjunctivae normal.    Cardiovascular:     Rate and Rhythm: Normal rate.  Pulmonary:     Effort: Pulmonary effort is normal. No respiratory distress.   Musculoskeletal:     Comments: Normal range of motion of right hip, right knee, right ankle, no apparent swelling noted to right leg diffusely   Neurological:     Mental Status: She is alert.   Psychiatric:        Mood and Affect: Mood normal.        Behavior: Behavior normal.        Thought Content: Thought content normal.      UC Treatments / Results  Labs (all labs ordered are listed, but only abnormal results are displayed) Labs Reviewed - No data to display  EKG   Radiology No results found.  Procedures Procedures (including critical care time)  Medications Ordered in UC Medications - No data to display  Initial Impression / Assessment and Plan / UC Course  I have reviewed the triage vital signs and the nursing notes.  Pertinent labs & imaging results that were available during my care of the patient were reviewed by me and considered in my medical decision making (see chart for details).    Unclear etiology of symptoms, possible muscle strain.  Recommended ibuprofen  if needed for pain, rest and follow-up with Ortho if symptoms persist.  Patient expressed understanding.  Final Clinical Impressions(s) / UC Diagnoses   Final diagnoses:  Right leg pain   Discharge Instructions   None    ED Prescriptions   None    PDMP not reviewed this encounter.   Vernestine Gondola, PA-C 06/04/24 1955

## 2024-06-04 NOTE — ED Triage Notes (Signed)
 Pt reports right leg pain, hurts to bear weight on the leg or to walk on it pt denies any injury.

## 2024-11-18 ENCOUNTER — Ambulatory Visit: Admission: EM | Admit: 2024-11-18 | Discharge: 2024-11-18 | Disposition: A | Discharge status: Home / Self Care

## 2024-11-18 ENCOUNTER — Encounter: Payer: Self-pay | Admitting: Emergency Medicine

## 2024-11-18 DIAGNOSIS — K529 Noninfective gastroenteritis and colitis, unspecified: Secondary | ICD-10-CM | POA: Diagnosis not present

## 2024-11-18 DIAGNOSIS — J069 Acute upper respiratory infection, unspecified: Secondary | ICD-10-CM | POA: Diagnosis not present

## 2024-11-18 NOTE — ED Triage Notes (Addendum)
 Pt presents c/o sorer throat, productive cough, nasal congestion, chest tightness, and sore abd muscles, emesis and diarrhea x 1 day. Pt states,  I been having a sore throat and tight chest and my stomach muscles hurt from coughing. I been on and off the toilet and I threw up a few times.    Pt declined testing for COVID and Flu.

## 2024-11-18 NOTE — Discharge Instructions (Signed)
 You been diagnosed with a viral illness today. -Viruses have to run their course and medicines that are prescribed are meant to help with symptoms. - With viruses usually feel poorly from 3 to 7 days with cough being the last symptoms to resolve.  -Cough can linger from days to weeks.  Antibiotics are not effective for viruses. -If your cough lasts more than 2 weeks and you are coughing so hard that you are vomiting or feel like you could pass out we need to follow-up with PCP for further testing and evaluation. -Rest, increase water intake, may use pseudoephedrine for nasal congestion, Delsym (dextromethorphan) or honey as needed for cough, and ibuprofen  and/or Tylenol as directed on packaging for pain and fever. -If you have hypertension you should take Coricidin or other OTC meds approved for people with high blood pressure. -You may use a spoonful of honey every 4-6 hours as needed for throat pain and cough. -Warm tea with honey and lemon are helpful for soothe throat as well.  Chloraseptic and Cepacol make a throat lozenge with numbing medication, can be purchased over-the-counter. -May also use Flonase  or sinus rinse for sinus pressure or nasal congestion.  Be sure to use distilled bottled water for sinus rinses. -May use coolmist humidifier to open up nasal passages -May elevate head to assist with postnasal drainage. -If you feel poorly (fever, fatigue, shortness of breath, nausea, etc.) for more than 10 days to be sure to follow-up with PCP or in clinic for further evaluation and additional treatments. If you experience chest pain with shortness of breath or pulse oxygen less than 95% you should report to the ER.   You have been diagnosed with a gastrointestinal issue today with symptoms of nausea, vomiting, and/or diarrhea.  It is best that you eat a bland diet which includes dry toast, applesauce, bananas, chicken broth, plain rice, or plain mashed potatoes eat these things with no  salt-and-pepper, excessive butter, or other seasonings.  It is also important to drink plenty of fluids as you are losing a lot of electrolytes due to vomiting and having diarrhea.  Diluted Gatorade, water, Pedialyte, liquid IV, etc. are good choices for fluid intake.  You may also use popsicles and/or Italian ice.  It is recommended that you do not use anything to stop your diarrhea as it is your body's way of getting rid of the offending bug.  If your symptoms are not improving within 3 to 5 days, you are experiencing significant fatigue, or you are urinating less frequently you will need to follow-up with your PCP or report to the ER.

## 2024-11-18 NOTE — ED Provider Notes (Signed)
 [name]    CSN: [id number] Arrival date & time: 11/18/24  1436      History   Chief Complaint Chief Complaint  Patient presents with   URI   Chest Pain    HPI Melanie Stephenson is a 23 y.o. female.   Patient presents today due to body aches, chest tightness, cough productive of yellow sputum, nasal congestion, 2 episodes of vomiting and 7 episodes of diarrhea that all started yesterday.  Patient states that she ate arugula yesterday around noon and vomited about an hour after eating it.  Patient denies any other suspect food intake.  Patient declines COVID and flu testing.  Patient states that she used Pepto-Bismol.  The history is provided by the patient.  URI Chest Pain   Past Medical History:  Diagnosis Date   ADD (attention deficit disorder)    Mild asthma     Patient Active Problem List   Diagnosis Date Noted   Scoliosis, congenital 01/23/2016   Scoliosis 12/05/2014   ADD (attention deficit disorder)     History reviewed. No pertinent surgical history.  OB History   No obstetric history on file.      Home Medications    Prior to Admission medications   Medication Sig Start Date End Date Taking? Authorizing Provider  albuterol  (VENTOLIN  HFA) 108 (90 Base) MCG/ACT inhaler Inhale 2 puffs into the lungs every 4 (four) hours as needed for wheezing or shortness of breath. 09/30/23   Arloa Suzen RAMAN, NP  cetirizine  (ZYRTEC ) 10 MG tablet Take 1 tablet (10 mg total) by mouth daily. 09/30/23   Arloa Suzen RAMAN, NP  Ferrous Sulfate (IRON PO) Take by mouth.    [provider]  ibuprofen  (ADVIL ) 600 MG tablet Take 1 tablet (600 mg total) by mouth every 6 (six) hours as needed. 06/28/23   Silver Wonda LABOR, PA  ibuprofen  (ADVIL ) 600 MG tablet Take 1 tablet (600 mg total) by mouth every 6 (six) hours as needed. 03/18/24   Dasie Faden, MD  lisdexamfetamine (VYVANSE) 40 MG capsule Take 40 mg by mouth every morning. 01/02/19   [provider]  methocarbamol  (ROBAXIN ) 500 MG tablet Take 1 tablet (500 mg total) by mouth 2 (two) times daily. 03/18/24   Dasie Faden, MD  ondansetron  (ZOFRAN -ODT) 4 MG disintegrating tablet Take 1 tablet (4 mg total) by mouth every 8 (eight) hours as needed for nausea or vomiting. 08/05/23   Enedelia Dorna HERO, FNP  predniSONE  (DELTASONE ) 20 MG tablet Take 2 tablets (40 mg total) by mouth daily with breakfast. 09/30/23   Arloa Suzen RAMAN, NP    Family History Family History  Problem Relation Age of Onset   Healthy Mother    Healthy Father    Healthy Other     Social History Social History   Tobacco Use   Smoking status: Never    Passive exposure: Never   Smokeless tobacco: Never  Vaping Use   Vaping status: Never Used  Substance Use Topics   Alcohol use: Not Currently   Drug use: Never     Allergies   Other   Review of Systems Review of Systems  Cardiovascular:  Positive for chest pain.     Physical Exam Triage Vital Signs ED Triage Vitals  Encounter Vitals Group     BP 11/18/24 1521 135/85     Girls Systolic BP Percentile --      Girls Diastolic BP Percentile --      Boys Systolic BP  Percentile --      Boys Diastolic BP Percentile --      Pulse Rate 11/18/24 1521 77     Resp 11/18/24 1521 18     Temp 11/18/24 1521 98.8 F (37.1 C)     Temp Source 11/18/24 1521 Oral     SpO2 11/18/24 1521 97 %     Weight 11/18/24 1520 180 lb (81.6 kg)     Height --      Head Circumference --      Peak Flow --      Pain Score 11/18/24 1519 7     Pain Loc --      Pain Education --      Exclude from Growth Chart --    No data found.  Updated Vital Signs BP 135/85 (BP Location: Left Arm)   Pulse 77   Temp 98.8 F (37.1 C) (Oral)   Resp 18   Wt 180 lb (81.6 kg)   LMP  (LMP Unknown)   SpO2 97%   BMI 32.92 kg/m   Visual Acuity Right Eye Distance:   Left Eye Distance:   Bilateral Distance:    Right Eye Near:   Left Eye Near:    Bilateral Near:     Physical  Exam Vitals and nursing note reviewed.  Constitutional:      General: She is not in acute distress.    Appearance: Normal appearance. She is not ill-appearing, toxic-appearing or diaphoretic.  HENT:     Nose: Congestion (moderately enlarged turbinates) present. No rhinorrhea.     Mouth/Throat:     Mouth: Mucous membranes are moist.     Pharynx: Oropharynx is clear. No oropharyngeal exudate or posterior oropharyngeal erythema.  Eyes:     General: No scleral icterus. Cardiovascular:     Rate and Rhythm: Normal rate and regular rhythm.     Heart sounds: Normal heart sounds.  Pulmonary:     Effort: Pulmonary effort is normal. No respiratory distress.     Breath sounds: Normal breath sounds. No wheezing or rhonchi.  Abdominal:     General: Abdomen is flat. Bowel sounds are normal.     Palpations: Abdomen is soft.     Tenderness: There is generalized abdominal tenderness. There is no right CVA tenderness or left CVA tenderness.  Skin:    General: Skin is warm.  Neurological:     Mental Status: She is alert and oriented to person, place, and time.  Psychiatric:        Mood and Affect: Mood normal.        Behavior: Behavior normal.      UC Treatments / Results  Labs (all labs ordered are listed, but only abnormal results are displayed) Labs Reviewed - No data to display  EKG   Radiology No results found.  Procedures Procedures (including critical care time)  Medications Ordered in UC Medications - No data to display  Initial Impression / Assessment and Plan / UC Course  I have reviewed the triage vital signs and the nursing notes.  Pertinent labs & imaging results that were available during my care of the patient were reviewed by me and considered in my medical decision making (see chart for details).    Final Clinical Impressions(s) / UC Diagnoses   Final diagnoses:  Viral URI  Acute gastroenteritis     Discharge Instructions      You been diagnosed with a  viral illness today. -Viruses have to run their course and  medicines that are prescribed are meant to help with symptoms. - With viruses usually feel poorly from 3 to 7 days with cough being the last symptoms to resolve.  -Cough can linger from days to weeks.  Antibiotics are not effective for viruses. -If your cough lasts more than 2 weeks and you are coughing so hard that you are vomiting or feel like you could pass out we need to follow-up with PCP for further testing and evaluation. -Rest, increase water intake, may use pseudoephedrine for nasal congestion, Delsym (dextromethorphan) or honey as needed for cough, and ibuprofen  and/or Tylenol as directed on packaging for pain and fever. -If you have hypertension you should take Coricidin or other OTC meds approved for people with high blood pressure. -You may use a spoonful of honey every 4-6 hours as needed for throat pain and cough. -Warm tea with honey and lemon are helpful for soothe throat as well.  Chloraseptic and Cepacol make a throat lozenge with numbing medication, can be purchased over-the-counter. -May also use Flonase  or sinus rinse for sinus pressure or nasal congestion.  Be sure to use distilled bottled water for sinus rinses. -May use coolmist humidifier to open up nasal passages -May elevate head to assist with postnasal drainage. -If you feel poorly (fever, fatigue, shortness of breath, nausea, etc.) for more than 10 days to be sure to follow-up with PCP or in clinic for further evaluation and additional treatments. If you experience chest pain with shortness of breath or pulse oxygen less than 95% you should report to the ER.   You have been diagnosed with a gastrointestinal issue today with symptoms of nausea, vomiting, and/or diarrhea.  It is best that you eat a bland diet which includes dry toast, applesauce, bananas, chicken broth, plain rice, or plain mashed potatoes eat these things with no salt-and-pepper, excessive butter,  or other seasonings.  It is also important to drink plenty of fluids as you are losing a lot of electrolytes due to vomiting and having diarrhea.  Diluted Gatorade, water, Pedialyte, liquid IV, etc. are good choices for fluid intake.  You may also use popsicles and/or Italian ice.  It is recommended that you do not use anything to stop your diarrhea as it is your body's way of getting rid of the offending bug.  If your symptoms are not improving within 3 to 5 days, you are experiencing significant fatigue, or you are urinating less frequently you will need to follow-up with your PCP or report to the ER.     ED Prescriptions   None    PDMP not reviewed this encounter.   Andra Corean BROCKS, PA-C 11/18/24 1553
